# Patient Record
Sex: Female | Born: 1985 | Race: White | Hispanic: No | Marital: Single | Smoking: Former smoker
Health system: Southern US, Community
[De-identification: ages and names within clinical notes are randomized; demographics above are authoritative.]

## PROBLEM LIST (undated history)

## (undated) DIAGNOSIS — F32A Depression, unspecified: Secondary | ICD-10-CM

## (undated) DIAGNOSIS — F1910 Other psychoactive substance abuse, uncomplicated: Secondary | ICD-10-CM

## (undated) DIAGNOSIS — F329 Major depressive disorder, single episode, unspecified: Secondary | ICD-10-CM

## (undated) DIAGNOSIS — F419 Anxiety disorder, unspecified: Secondary | ICD-10-CM

## (undated) HISTORY — DX: Anxiety disorder, unspecified: F41.9

## (undated) HISTORY — DX: Other psychoactive substance abuse, uncomplicated: F19.10

## (undated) HISTORY — DX: Major depressive disorder, single episode, unspecified: F32.9

## (undated) HISTORY — DX: Depression, unspecified: F32.A

---

## 2015-12-04 ENCOUNTER — Ambulatory Visit (INDEPENDENT_AMBULATORY_CARE_PROVIDER_SITE_OTHER): Payer: BLUE CROSS/BLUE SHIELD | Admitting: Emergency Medicine

## 2015-12-04 VITALS — BP 98/64 | HR 124 | Temp 98.4°F | Resp 16 | Ht 63.5 in | Wt 108.6 lb

## 2015-12-04 DIAGNOSIS — R0981 Nasal congestion: Secondary | ICD-10-CM

## 2015-12-04 DIAGNOSIS — F1911 Other psychoactive substance abuse, in remission: Secondary | ICD-10-CM

## 2015-12-04 DIAGNOSIS — R067 Sneezing: Secondary | ICD-10-CM

## 2015-12-04 DIAGNOSIS — F1910 Other psychoactive substance abuse, uncomplicated: Secondary | ICD-10-CM | POA: Diagnosis not present

## 2015-12-04 LAB — POCT RAPID STREP A (OFFICE): RAPID STREP A SCREEN: NEGATIVE

## 2015-12-04 LAB — POCT INFLUENZA A/B
Influenza A, POC: NEGATIVE
Influenza B, POC: NEGATIVE

## 2015-12-04 NOTE — Patient Instructions (Addendum)
IF you received an x-ray today, you will receive an invoice from [facility]. Please contact [facility] at [phone] with questions or concerns regarding your invoice.   IF you received labwork today, you will receive an invoice from [employer]. Please contact [facility] at [phone] with questions or concerns regarding your invoice.   Our billing staff will not be able to assist you with questions regarding bills from these companies.  You will be contacted with the lab results as soon as they are available. The fastest way to get your results is to activate your My Chart account. Instructions are located on the last page of this paperwork. If you have not heard from [place] regarding the results in 2 weeks, please contact this office. Please take plain Zyrtec 10 mg 1 a day. Please try Flonase to help with nasal congestion. Do not take any medications with stimulants in them.

## 2015-12-04 NOTE — Progress Notes (Signed)
By signing my name below, I, Judithe Modest, attest that this documentation has been prepared under the direction and in the presence of Nena Jordan, MD. Electronically Signed: Judithe Modest, ER Scribe. 12/04/2015. 2:17 PM.  Chief Complaint:  Chief Complaint  Patient presents with  . Nasal Congestion    x1 day  . Cough  . sneezing   HPI: Amanda Horn is a 30 y.o. female who reports to [facility] today complaining of sneezing, cough, congestion, and rhinorrhea for the last 24 hours. She tested positive for flu two weeks ago and was prescribed tamiflu. She has had a past hx of sinus infections. She was blowing out green drainage in the morning, but by this afternoon it was clear. She is a current everyday smoker. Her sx this time are very different than her sx two weeks ago. Her teeth hurt. She has no past hx of allergies.    Past Medical History  Diagnosis Date  . Anxiety   . Depression   . Substance abuse    No past surgical history on file. Social History   Social History  . Marital Status: Single    Spouse Name: N/A  . Number of Children: N/A  . Years of Education: N/A   Social History Main Topics  . Smoking status: Current Every Day Smoker  . Smokeless tobacco: Not on file  . Alcohol Use: No  . Drug Use: No  . Sexual Activity: Not on file   Other Topics Concern  . Not on file   Social History Narrative  . No narrative on file   Family History  Problem Relation Age of Onset  . Cancer Maternal Grandmother    Allergies  Allergen Reactions  . Penicillins Hives  . Sulfa Antibiotics Hives   Prior to Admission medications   Medication Sig Start Date End Date Taking? Authorizing Provider  busPIRone (BUSPAR) 30 MG tablet Take 30 mg by mouth 2 (two) times daily.   Yes Historical Provider, MD  gabapentin (NEURONTIN) 600 MG tablet Take 600 mg by mouth 3 (three) times daily.   Yes Historical Provider, MD  propranolol (INDERAL) 10 MG tablet Take 10 mg by mouth 2 (two)  times daily.   Yes Historical Provider, MD  venlafaxine XR (EFFEXOR-XR) 150 MG 24 hr capsule Take 150 mg by mouth daily with breakfast.   Yes Historical Provider, MD     ROS: The patient denies fevers, chills, night sweats, unintentional weight loss, chest pain, palpitations, wheezing, dyspnea on exertion, nausea, vomiting, abdominal pain, dysuria, hematuria, melena, numbness, weakness, or tingling.   All other systems have been reviewed and were otherwise negative with the exception of those mentioned in the HPI and as above.    PHYSICAL EXAM: Filed Vitals:   12/04/15 1357  BP: 98/64  Pulse: 124  Temp: 98.4 F (36.9 C)  Resp: 16   Body mass index is 18.93 kg/(m^2).   General: Alert, no acute distress HEENT:  Normocephalic, atraumatic, oropharynx patent. Throat with erythema. Significant nasal congestion. Ears normal. Eye: [facility], [facility] Cardiovascular:  Regular rate and rhythm, no rubs murmurs or gallops.  No Carotid bruits, radial pulse intact. No pedal edema.  Respiratory: Clear to auscultation bilaterally.  No wheezes, rales, or rhonchi.  No cyanosis, no use of accessory musculature Abdominal: No organomegaly, abdomen is soft and non-tender, positive bowel sounds.  No masses. Musculoskeletal: Gait intact. No edema, tenderness Skin: No rashes. Neurologic: Facial musculature symmetric. Psychiatric: Patient acts appropriately throughout our interaction. Lymphatic:  No cervical or submandibular lymphadenopathy    LABS: Results for orders placed or performed in visit on 12/04/15  POCT rapid strep A  Result Value Ref Range   Rapid Strep A Screen Negative Negative  POCT Influenza A/B  Result Value Ref Range   Influenza A, POC Negative Negative   Influenza B, POC Negative Negative    EKG/XRAY:   Primary read interpreted by [facility]. Everlene Farrier at [facility].   ASSESSMENT/PLAN: Patient will be on Zyrtec and Flonase. No stimulants. Testing for flu and strep are negative. Patient was  tachycardic in the office but has issues with tachycardia and anxiety and is on Inderal for this. I personally performed the services described in this documentation, which was scribed in my presence. The recorded information has been reviewed and is accurate.  Gross sideeffects, risk and benefits, and alternatives of medications d/w patient. Patient is aware that all medications have potential sideeffects and we are unable to predict every sideeffect or drug-drug interaction that may occur.  Arlyss Queen MD 12/04/2015 2:17 PM

## 2016-06-01 ENCOUNTER — Ambulatory Visit (INDEPENDENT_AMBULATORY_CARE_PROVIDER_SITE_OTHER): Payer: BLUE CROSS/BLUE SHIELD | Admitting: Physician Assistant

## 2016-06-01 VITALS — BP 130/74 | HR 99 | Temp 99.3°F | Resp 19 | Ht 63.5 in | Wt 108.4 lb

## 2016-06-01 DIAGNOSIS — Z202 Contact with and (suspected) exposure to infections with a predominantly sexual mode of transmission: Secondary | ICD-10-CM | POA: Diagnosis not present

## 2016-06-01 DIAGNOSIS — N9089 Other specified noninflammatory disorders of vulva and perineum: Secondary | ICD-10-CM | POA: Diagnosis not present

## 2016-06-01 MED ORDER — VALACYCLOVIR HCL 500 MG PO TABS: 500.0000 mg | ORAL_TABLET | Freq: Two times a day (BID) | ORAL | 0 refills | Status: AC

## 2016-06-01 NOTE — Progress Notes (Signed)
   Amanda Horn  MRN: [id number] DOB: 1985/12/09  PCP: No primary care provider on file.  Subjective:  Pt is a 30 y.o. female, history of substance abuse, presenting to clinic for lesion on vagina x 3 days. She is accompanied by her sexual partner. She first noticed the single lesion after she shaved. Does not recall cutting herself with razor.  Painful to touch.  No drainage, discharge, burning, itching. Denies fever, chills, recent illness.  Has unprotected sex with new partner (present today). He does not have known history of HSV or STDs.  No history of herpes outbreaks.   Review of Systems  Constitutional: Negative.   HENT: Negative for mouth sores.   Gastrointestinal: Negative.   Genitourinary: Positive for genital sores. Negative for difficulty urinating, dyspareunia, dysuria, menstrual problem, pelvic pain, urgency, vaginal bleeding, vaginal discharge and vaginal pain.  Skin: Negative.     Patient Active Problem List   Diagnosis Date Noted  . 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 gabapentin (NEURONTIN) 600 MG tablet Take 600 mg by mouth 3 (three) times daily.    Marland Kitchen venlafaxine XR (EFFEXOR-XR) 150 MG 24 hr capsule Take 150 mg by mouth daily with breakfast.     No current facility-administered medications on file prior to visit.     Allergies  Allergen Reactions  . Penicillins Hives  . Sulfa Antibiotics Hives    Objective:  BP 130/74 (BP Location: Right Arm, Patient Position: Sitting, Cuff Size: Small)   Pulse 99   Temp 99.3 F (37.4 C) (Oral)   Resp 19   Ht 5' 3.5" (1.613 m)   Wt 108 lb 6.4 oz (49.2 kg)   LMP 05/11/2016   SpO2 100%   BMI 18.90 kg/m   Physical Exam  Constitutional: She is oriented to person, place, and time and well-developed, well-nourished, and in no distress. No distress.  Cardiovascular: Normal rate, regular rhythm and normal heart sounds.     Pulmonary/Chest: Effort normal. No respiratory distress.  Genitourinary: Cervix exhibits no tenderness. Vulva exhibits lesion (Posterior labia minora, left. 0.5 cm Nodular, TTP. No erythema, drainage ).  Genitourinary Comments: Cervix, Nabothian cyst. No abnormal transformation zone. Cervix is friable.   Neurological: She is alert and oriented to person, place, and time. GCS score is 15.  Skin: Skin is warm and dry.  Psychiatric: Mood, memory, affect and judgment normal.  Vitals reviewed.   Assessment and Plan :  1. Possible exposure to STD - Pap IG, CT/NG w/ reflex HPV when ASC-U - Herpes simplex virus culture 2. Labial lesion - valACYclovir (VALTREX) 500 MG tablet; Take 1 tablet (500 mg total) by mouth 2 (two) times daily.  Dispense: 20 tablet; Refill: 0 - Patient asked for Valtrex just in case this is a herpes outbreak. Does not want to wait until the culture comes back.    Mercer Pod, PA-C  [facility] 06/01/2016 6:50 PM

## 2016-06-01 NOTE — Patient Instructions (Signed)
     IF you received an x-ray today, you will receive an invoice from [facility]. Please contact [facility] at [phone] with questions or concerns regarding your invoice.   IF you received labwork today, you will receive an invoice from [facility]. Please contact [facility] at [phone] with questions or concerns regarding your invoice.   Our billing staff will not be able to assist you with questions regarding bills from these companies.  You will be contacted with the lab results as soon as they are available. The fastest way to get your results is to activate your My Chart account. Instructions are located on the last page of this paperwork. If you have not heard from us regarding the results in 2 weeks, please contact this office.      

## 2016-06-04 LAB — PAP IG, CT-NG, RFX HPV ASCU
Chlamydia Probe Amp: NOT DETECTED
GC Probe Amp: NOT DETECTED

## 2016-06-06 ENCOUNTER — Telehealth: Payer: Self-pay

## 2016-06-06 NOTE — Telephone Encounter (Signed)
Patient was recently seen for a possible STD and states that the medication isn't working. Patient states that there are no side effect but the symptoms are getting worse.  Please advise!  Patient would like [place] to leave a voicemail if she doesn't answer. [phone]

## 2016-06-07 LAB — HERPES SIMPLEX VIRUS CULTURE: Organism ID, Bacteria: DETECTED

## 2016-06-11 ENCOUNTER — Other Ambulatory Visit: Payer: Self-pay | Admitting: Physician Assistant

## 2016-06-11 DIAGNOSIS — B009 Herpesviral infection, unspecified: Secondary | ICD-10-CM | POA: Insufficient documentation

## 2016-06-11 MED ORDER — VALACYCLOVIR HCL 1 G PO TABS: 1000.0000 mg | ORAL_TABLET | Freq: Two times a day (BID) | ORAL | 0 refills | Status: DC

## 2016-06-11 NOTE — Telephone Encounter (Signed)
Pt states that her symptoms are not improving and would like to get an increased dosage of valtrex. Pt states that she was under the impression the dosage would increase once the culture came back as positive.

## 2016-06-11 NOTE — Telephone Encounter (Signed)
Please let patient know her prescription should be at her pharmacy.

## 2016-06-11 NOTE — Telephone Encounter (Signed)
Patient still has symptons and would like stronger medications called in, based on telephone message LOvM

## 2016-06-13 NOTE — Telephone Encounter (Signed)
Called pt, left voicemail stating that prescription is available for pick up

## 2016-07-22 ENCOUNTER — Telehealth: Payer: Self-pay | Admitting: Family Medicine

## 2016-07-22 DIAGNOSIS — B009 Herpesviral infection, unspecified: Secondary | ICD-10-CM

## 2016-07-22 MED ORDER — VALACYCLOVIR HCL 1 G PO TABS: 1000.0000 mg | ORAL_TABLET | Freq: Every day | ORAL | 5 refills | Status: AC

## 2016-07-22 NOTE — Telephone Encounter (Signed)
Patient called after hours line; requesting refill of Valtrex.  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 Spoke with patient to discuss suppression option; sent in to local pharmacy.

## 2017-03-26 ENCOUNTER — Other Ambulatory Visit: Payer: Self-pay | Admitting: Family Medicine

## 2017-03-26 DIAGNOSIS — N63 Unspecified lump in unspecified breast: Secondary | ICD-10-CM

## 2017-03-28 ENCOUNTER — Other Ambulatory Visit: Payer: Self-pay | Admitting: Family Medicine

## 2017-03-28 DIAGNOSIS — N63 Unspecified lump in unspecified breast: Secondary | ICD-10-CM

## 2017-04-04 ENCOUNTER — Ambulatory Visit
Admission: RE | Admit: 2017-04-04 | Discharge: 2017-04-04 | Disposition: A | Discharge status: Home / Self Care | Payer: BLUE CROSS/BLUE SHIELD | Source: Ambulatory Visit | Attending: Family Medicine | Admitting: Family Medicine

## 2017-04-04 ENCOUNTER — Other Ambulatory Visit: Payer: Self-pay | Admitting: Family Medicine

## 2017-04-04 DIAGNOSIS — N63 Unspecified lump in unspecified breast: Secondary | ICD-10-CM

## 2017-04-22 ENCOUNTER — Other Ambulatory Visit: Payer: Self-pay | Admitting: Family Medicine

## 2017-04-22 DIAGNOSIS — N6310 Unspecified lump in the right breast, unspecified quadrant: Secondary | ICD-10-CM

## 2017-04-22 DIAGNOSIS — N644 Mastodynia: Secondary | ICD-10-CM

## 2017-04-23 ENCOUNTER — Other Ambulatory Visit: Payer: Self-pay | Admitting: Family Medicine

## 2017-04-23 DIAGNOSIS — N6310 Unspecified lump in the right breast, unspecified quadrant: Secondary | ICD-10-CM

## 2017-04-23 DIAGNOSIS — N644 Mastodynia: Secondary | ICD-10-CM

## 2017-04-23 DIAGNOSIS — N63 Unspecified lump in unspecified breast: Secondary | ICD-10-CM

## 2017-04-30 ENCOUNTER — Other Ambulatory Visit: Payer: BLUE CROSS/BLUE SHIELD

## 2017-12-04 ENCOUNTER — Encounter: Payer: BLUE CROSS/BLUE SHIELD | Admitting: Certified Nurse Midwife

## 2018-02-07 ENCOUNTER — Encounter: Payer: Self-pay | Admitting: Family Medicine

## 2018-02-12 ENCOUNTER — Encounter: Payer: Self-pay | Admitting: Family Medicine

## 2018-03-11 ENCOUNTER — Ambulatory Visit: Payer: Managed Care, Other (non HMO) | Admitting: Physician Assistant

## 2018-03-11 ENCOUNTER — Encounter

## 2018-03-11 ENCOUNTER — Other Ambulatory Visit: Payer: Self-pay

## 2018-03-11 ENCOUNTER — Encounter: Payer: Self-pay | Admitting: Physician Assistant

## 2018-03-11 DIAGNOSIS — D229 Melanocytic nevi, unspecified: Secondary | ICD-10-CM | POA: Diagnosis not present

## 2018-03-11 DIAGNOSIS — F17200 Nicotine dependence, unspecified, uncomplicated: Secondary | ICD-10-CM

## 2018-03-11 DIAGNOSIS — F1911 Other psychoactive substance abuse, in remission: Secondary | ICD-10-CM | POA: Diagnosis not present

## 2018-03-11 DIAGNOSIS — F411 Generalized anxiety disorder: Secondary | ICD-10-CM | POA: Diagnosis not present

## 2018-03-11 NOTE — Patient Instructions (Addendum)
   IF you received an x-ray today, you will receive an invoice from [facility]. Please contact [facility] at [phone] with questions or concerns regarding your invoice.   IF you received labwork today, you will receive an invoice from [facility]. Please contact [facility] at [phone] with questions or concerns regarding your invoice.   Our billing staff will not be able to assist you with questions regarding bills from these companies.  You will be contacted with the lab results as soon as they are available. The fastest way to get your results is to activate your My Chart account. Instructions are located on the last page of this paperwork. If you have not heard from us regarding the results in 2 weeks, please contact this office.    Health Maintenance, Female Adopting a healthy lifestyle and getting preventive care can go a long way to promote health and wellness. Talk with your health care provider about what schedule of regular examinations is right for you. This is a good chance for you to check in with your provider about disease prevention and staying healthy. In between checkups, there are plenty of things you can do on your own. Experts have done a lot of research about which lifestyle changes and preventive measures are most likely to keep you healthy. Ask your health care provider for more information. Weight and diet Eat a healthy diet  Be sure to include plenty of vegetables, fruits, low-fat dairy products, and lean protein.  Do not eat a lot of foods high in solid fats, added sugars, or salt.  Get regular exercise. This is one of the most important things you can do for your health. ? Most adults should exercise for at least 150 minutes each week. The exercise should increase your heart rate and make you sweat (moderate-intensity exercise). ? Most adults should also do strengthening exercises at least twice a week. This is in addition to the  moderate-intensity exercise.  Maintain a healthy weight  Body mass index (BMI) is a measurement that can be used to identify possible weight problems. It estimates body fat based on height and weight. Your health care provider can help determine your BMI and help you achieve or maintain a healthy weight.  For females 20 years of age and older: ? A BMI below 18.5 is considered underweight. ? A BMI of 18.5 to 24.9 is normal. ? A BMI of 25 to 29.9 is considered overweight. ? A BMI of 30 and above is considered obese.  Watch levels of cholesterol and blood lipids  You should start having your blood tested for lipids and cholesterol at 31 years of age, then have this test every 5 years.  You may need to have your cholesterol levels checked more often if: ? Your lipid or cholesterol levels are high. ? You are older than 31 years of age. ? You are at high risk for heart disease.  Cancer screening Lung Cancer  Lung cancer screening is recommended for adults 55-80 years old who are at high risk for lung cancer because of a history of smoking.  A yearly low-dose CT scan of the lungs is recommended for people who: ? Currently smoke. ? Have quit within the past 15 years. ? Have at least a 30-pack-year history of smoking. A pack year is smoking an average of one pack of cigarettes a day for 1 year.  Yearly screening should continue until it has been 15 years since you quit.  Yearly screening   should stop if you develop a health problem that would prevent you from having lung cancer treatment.  Breast Cancer  Practice breast self-awareness. This means understanding how your breasts normally appear and feel.  It also means doing regular breast self-exams. Let your health care provider know about any changes, no matter how small.  If you are in your 20s or 30s, you should have a clinical breast exam (CBE) by a health care provider every 1-3 years as part of a regular health exam.  If you  are 42 or older, have a CBE every year. Also consider having a breast X-ray (mammogram) every year.  If you have a family history of breast cancer, talk to your health care provider about genetic screening.  If you are at high risk for breast cancer, talk to your health care provider about having an MRI and a mammogram every year.  Breast cancer gene (BRCA) assessment is recommended for women who have family members with BRCA-related cancers. BRCA-related cancers include: ? Breast. ? Ovarian. ? Tubal. ? Peritoneal cancers.  Results of the assessment will determine the need for genetic counseling and BRCA1 and BRCA2 testing.  Cervical Cancer Your health care provider may recommend that you be screened regularly for cancer of the pelvic organs (ovaries, uterus, and vagina). This screening involves a pelvic examination, including checking for microscopic changes to the surface of your cervix (Pap test). You may be encouraged to have this screening done every 3 years, beginning at age 56.  For women ages 41-65, health care providers may recommend pelvic exams and Pap testing every 3 years, or they may recommend the Pap and pelvic exam, combined with testing for human papilloma virus (HPV), every 5 years. Some types of HPV increase your risk of cervical cancer. Testing for HPV may also be done on women of any age with unclear Pap test results.  Other health care providers may not recommend any screening for nonpregnant women who are considered low risk for pelvic cancer and who do not have symptoms. Ask your health care provider if a screening pelvic exam is right for you.  If you have had past treatment for cervical cancer or a condition that could lead to cancer, you need Pap tests and screening for cancer for at least 20 years after your treatment. If Pap tests have been discontinued, your risk factors (such as having a new sexual partner) need to be reassessed to determine if screening should  resume. Some women have medical problems that increase the chance of getting cervical cancer. In these cases, your health care provider may recommend more frequent screening and Pap tests.  Colorectal Cancer  This type of cancer can be detected and often prevented.  Routine colorectal cancer screening usually begins at 31 years of age and continues through 31 years of age.  Your health care provider may recommend screening at an earlier age if you have risk factors for colon cancer.  Your health care provider may also recommend using home test kits to check for hidden blood in the stool.  A small camera at the end of a tube can be used to examine your colon directly (sigmoidoscopy or colonoscopy). This is done to check for the earliest forms of colorectal cancer.  Routine screening usually begins at age 16.  Direct examination of the colon should be repeated every 5-10 years through 31 years of age. However, you may need to be screened more often if early forms of precancerous polyps or  small growths are found.  Skin Cancer  Check your skin from head to toe regularly.  Tell your health care provider about any new moles or changes in moles, especially if there is a change in a mole's shape or color.  Also tell your health care provider if you have a mole that is larger than the size of a pencil eraser.  Always use sunscreen. Apply sunscreen liberally and repeatedly throughout the day.  Protect yourself by wearing long sleeves, pants, a wide-brimmed hat, and sunglasses whenever you are outside.  Heart disease, diabetes, and high blood pressure  High blood pressure causes heart disease and increases the risk of stroke. High blood pressure is more likely to develop in: ? People who have blood pressure in the high end of the normal range (130-139/85-89 mm Hg). ? People who are overweight or obese. ? People who are African American.  If you are 61-36 years of age, have your blood  pressure checked every 3-5 years. If you are 6 years of age or older, have your blood pressure checked every year. You should have your blood pressure measured twice-once when you are at a hospital or clinic, and once when you are not at a hospital or clinic. Record the average of the two measurements. To check your blood pressure when you are not at a hospital or clinic, you can use: ? An automated blood pressure machine at a pharmacy. ? A home blood pressure monitor.  If you are between 7 years and 29 years old, ask your health care provider if you should take aspirin to prevent strokes.  Have regular diabetes screenings. This involves taking a blood sample to check your fasting blood sugar level. ? If you are at a normal weight and have a low risk for diabetes, have this test once every three years after 31 years of age. ? If you are overweight and have a high risk for diabetes, consider being tested at a younger age or more often. Preventing infection Hepatitis B  If you have a higher risk for hepatitis B, you should be screened for this virus. You are considered at high risk for hepatitis B if: ? You were born in a country where hepatitis B is common. Ask your health care provider which countries are considered high risk. ? Your parents were born in a high-risk country, and you have not been immunized against hepatitis B (hepatitis B vaccine). ? You have HIV or AIDS. ? You use needles to inject street drugs. ? You live with someone who has hepatitis B. ? You have had sex with someone who has hepatitis B. ? You get hemodialysis treatment. ? You take certain medicines for conditions, including cancer, organ transplantation, and autoimmune conditions.  Hepatitis C  Blood testing is recommended for: ? Everyone born from [birth year] through 1964. ? Anyone with known risk factors for hepatitis C.  Sexually transmitted infections (STIs)  You should be screened for sexually transmitted  infections (STIs) including gonorrhea and chlamydia if: ? You are sexually active and are younger than 31 years of age. ? You are older than 31 years of age and your health care provider tells you that you are at risk for this type of infection. ? Your sexual activity has changed since you were last screened and you are at an increased risk for chlamydia or gonorrhea. Ask your health care provider if you are at risk.  If you do not have HIV, but are at risk, it  may be recommended that you take a prescription medicine daily to prevent HIV infection. This is called pre-exposure prophylaxis (PrEP). You are considered at risk if: ? You are sexually active and do not regularly use condoms or know the HIV status of your partner(s). ? You take drugs by injection. ? You are sexually active with a partner who has HIV.  Talk with your health care provider about whether you are at high risk of being infected with HIV. If you choose to begin PrEP, you should first be tested for HIV. You should then be tested every 3 months for as long as you are taking PrEP. Pregnancy  If you are premenopausal and you may become pregnant, ask your health care provider about preconception counseling.  If you may become pregnant, take 400 to 800 micrograms (mcg) of folic acid every day.  If you want to prevent pregnancy, talk to your health care provider about birth control (contraception). Osteoporosis and menopause  Osteoporosis is a disease in which the bones lose minerals and strength with aging. This can result in serious bone fractures. Your risk for osteoporosis can be identified using a bone density scan.  If you are 65 years of age or older, or if you are at risk for osteoporosis and fractures, ask your health care provider if you should be screened.  Ask your health care provider whether you should take a calcium or vitamin D supplement to lower your risk for osteoporosis.  Menopause may have certain physical  symptoms and risks.  Hormone replacement therapy may reduce some of these symptoms and risks. Talk to your health care provider about whether hormone replacement therapy is right for you. Follow these instructions at home:  Schedule regular health, dental, and eye exams.  Stay current with your immunizations.  Do not use any tobacco products including cigarettes, chewing tobacco, or electronic cigarettes.  If you are pregnant, do not drink alcohol.  If you are breastfeeding, limit how much and how often you drink alcohol.  Limit alcohol intake to no more than 1 drink per day for nonpregnant women. One drink equals 12 ounces of beer, 5 ounces of wine, or 1 ounces of hard liquor.  Do not use street drugs.  Do not share needles.  Ask your health care provider for help if you need support or information about quitting drugs.  Tell your health care provider if you often feel depressed.  Tell your health care provider if you have ever been abused or do not feel safe at home. This information is not intended to replace advice given to you by your health care provider. Make sure you discuss any questions you have with your health care provider. Document Released: 03/26/2011 Document Revised: 02/16/2016 Document Reviewed: 06/14/2015 [employer] Interactive Patient Education  2018 [employer]  

## 2018-03-11 NOTE — Progress Notes (Signed)
Amanda Horn  MRN: [id number] DOB: 1986-03-08  PCP: Jonathon Jordan, MD  Chief Complaint  Patient presents with  . Establish Care    Subjective:  Pt presents to clinic to establish care.  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  Working on quitting smoking - down to 2 cig a day - she was a pack and half day smoker. For about 3 years  Parents live in [place] and she has been in treatment there and that is where she was started on baclofen for ETOH cravings and it does seems to help - she used it more during her early recovery and now she uses it prn.  She is treated for her anxiety which is what leads to her substance abuse.  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  Review of Systems  Constitutional: Negative for chills and fever.  Psychiatric/Behavioral: Negative for dysphoric mood. The patient is nervous/anxious.     Patient Active Problem List   Diagnosis Date Noted  . Multiple nevi 03/11/2018  . GAD (generalized anxiety disorder) 03/11/2018  . Smoker 03/11/2018  . 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 baclofen (LIORESAL) 10 MG tablet Take 10 mg by mouth 3 (three) times daily.    Marland Kitchen gabapentin (NEURONTIN) 600 MG tablet Take 600 mg by mouth 3 (three) times daily.    . valACYclovir (VALTREX) 1000 MG tablet Take 1 tablet (1,000 mg total) by mouth daily. Increase to bid for three days PRN outbreak 40 tablet 5  . venlafaxine XR (EFFEXOR-XR) 150 MG 24 hr capsule Take 150 mg by mouth daily with breakfast.     No current facility-administered medications on file prior to visit.     Allergies  Allergen Reactions  . Penicillins Hives  . Sulfa Antibiotics Hives    Past Medical History:  Diagnosis  Date  . Anxiety   . Depression   . 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 Smoking status: Current Every Day Smoker    Packs/day: 1.50    Start date: 03/11/2014  . Smokeless tobacco: Never Used  Substance Use Topics  . Alcohol use: No    Alcohol/week: 0.0 oz  . Drug use: No   family history includes Cancer in her maternal grandmother.     Objective:  BP 108/62   Pulse 87   Temp 99.2 F (37.3 C) (Oral)   Resp 18   Ht 5' 4.17" (1.63 m)   Wt 109 lb 6.4 oz (49.6 kg)   LMP 02/16/2018   SpO2 99%   BMI 18.68 kg/m  Body mass index is 18.68 kg/m.  Wt Readings from Last 3 Encounters:  03/11/18 109 lb 6.4 oz (49.6 kg)  06/01/16 108 lb 6.4 oz (49.2 kg)  12/04/15 108 lb 9.6 oz (49.3 kg)    Physical Exam  Constitutional: She is oriented to person, place, and time. She appears well-developed and well-nourished.  HENT:  Head: Normocephalic and atraumatic.  Right  Ear: Hearing and external ear normal.  Left Ear: Hearing and external ear normal.  Eyes: Conjunctivae are normal.  Neck: Normal range of motion.  Cardiovascular: Normal rate, regular rhythm and normal heart sounds.  No murmur heard. Pulmonary/Chest: Effort normal and breath sounds normal. She has no wheezes.  Neurological: She is alert and oriented to person, place, and time.  Skin: Skin is warm and dry.  Multiple nevus  Psychiatric: She has a normal mood and affect. Her behavior is normal. Judgment and thought content normal.  Vitals reviewed.   Assessment and Plan :  Multiple nevi congestive -  Plan: Ambulatory referral to Dermatology -patient has had 2 moles removed and has multiple nevi and warrants a dermatological yearly evaluation  GAD (generalized anxiety disorder) -well-controlled on current medications, continue current medications we will follow-up  when she needs refills.  Substance abuse in remission [facility]) -continue attending NA meetings and medications for substance abuse.  Smoker -congratulated on smoking cessation and encouraged to continue decreased   Windell Hummingbird PA-C  Primary Care at [place] 03/11/2018 2:50 PM

## 2018-03-17 ENCOUNTER — Encounter: Payer: Self-pay | Admitting: Physician Assistant

## 2018-04-29 ENCOUNTER — Ambulatory Visit: Payer: Managed Care, Other (non HMO) | Admitting: Physician Assistant

## 2018-04-29 ENCOUNTER — Encounter: Payer: Self-pay | Admitting: Physician Assistant

## 2018-04-29 ENCOUNTER — Other Ambulatory Visit: Payer: Self-pay

## 2018-04-29 VITALS — BP 102/68 | HR 83 | Temp 98.0°F | Resp 16 | Ht 64.96 in | Wt 112.8 lb

## 2018-04-29 DIAGNOSIS — L700 Acne vulgaris: Secondary | ICD-10-CM | POA: Diagnosis not present

## 2018-04-29 DIAGNOSIS — S90932A Unspecified superficial injury of left great toe, initial encounter: Secondary | ICD-10-CM

## 2018-04-29 DIAGNOSIS — T148XXA Other injury of unspecified body region, initial encounter: Secondary | ICD-10-CM

## 2018-04-29 DIAGNOSIS — Z3009 Encounter for other general counseling and advice on contraception: Secondary | ICD-10-CM | POA: Diagnosis not present

## 2018-04-29 LAB — POCT URINE PREGNANCY: PREG TEST UR: NEGATIVE

## 2018-04-29 MED ORDER — TRETINOIN 0.025 % EX GEL: Freq: Every day | CUTANEOUS | 1 refills | Status: AC

## 2018-04-29 MED ORDER — NORETHINDRONE ACET-ETHINYL EST 1-20 MG-MCG PO TABS: 1.0000 | ORAL_TABLET | Freq: Every day | ORAL | 4 refills | Status: AC

## 2018-04-29 NOTE — Progress Notes (Signed)
Amanda Horn  MRN: [id number] DOB: 1986/07/10  Subjective:  Amanda Horn is a 32 y.o. female seen in office today for a chief complaint of multiple complaints.   1) Toe injury: Has had a repetitive cut on bottom of big toe of left foot x 3 weeks. Does hot yoga and keeps reopening it on the mat.Yesterday, had some surrounding redness and swelling. "this morning some white stuff came out, not pus." Put neosporin on it today for the first time. Does hot yoga 8 x a week. On her feet for 9 hours at work.   2) Acne: Has dealt with face and truncal acne since she can remember. 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 Used to be on retin-a gel and this helped a lot. Was getting it from dermatologist in [place]. Has not had any since moving here. Using salycilic acid and benyzol peroxide cream face wash. Has also tried differin at home with no full relief.  3) birth control: Pt would also like to start oral birth control. Has tried different ones in the past.  No preference.  She is currently on her menstrual cycle.  Has not had sexual activity since being on her menstrual cycle.  No past medical history of HTN, heart disease, breast cancer, endometrial cancer, thromboembolism, migraine with aura, or diabetes.  No FH of stroke or thromboembolism. Denies smoking.    Review of Systems  Constitutional: Negative for chills, diaphoresis and fever.  Cardiovascular: Negative for chest pain.  Gastrointestinal: Negative for diarrhea and vomiting.  Neurological: Negative for dizziness and light-headedness.    Patient Active Problem List   Diagnosis Date Noted  . Multiple nevi 03/11/2018  . GAD (generalized anxiety disorder) 03/11/2018  . Smoker 03/11/2018  . 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 baclofen (LIORESAL) 10 MG tablet Take 10 mg by mouth 3  (three) times daily.    Marland Kitchen gabapentin (NEURONTIN) 600 MG tablet Take 600 mg by mouth 3 (three) times daily.    . valACYclovir (VALTREX) 1000 MG tablet Take 1 tablet (1,000 mg total) by mouth daily. Increase to bid for three days PRN outbreak 40 tablet 5  . venlafaxine XR (EFFEXOR-XR) 150 MG 24 hr capsule Take 150 mg by mouth daily with breakfast.     No current facility-administered medications on file prior to visit.     Allergies  Allergen Reactions  . Penicillins Hives  . Sulfa Antibiotics Hives   Social History   Socioeconomic History  . Marital status: Single    Spouse name: Not on file  . Number of children: Not on file  . Years of education: Not on file  . Highest education level: Not on file  Occupational History  . Not on file  Social Needs  . Financial resource strain: Not on file  . Food insecurity:    Worry: Not on file    Inability: Not on file  . Transportation needs:    Medical: Not on file    Non-medical: Not on file  Tobacco Use  . Smoking status: Current Every Day Smoker    Packs/day: 1.50    Start date: 03/11/2014  . Smokeless tobacco: Never Used  Substance and Sexual Activity  . Alcohol use: No    Alcohol/week: 0.0 oz  . Drug use: No  . Sexual activity:  Yes    Partners: Male  Lifestyle  . Physical activity:    Days per week: Not on file    Minutes per session: Not on file  . Stress: Not on file  Relationships  . Social connections:    Talks on phone: Not on file    Gets together: Not on file    Attends religious service: Not on file    Active member of club or organization: Not on file    Attends meetings of clubs or organizations: Not on file    Relationship status: Not on file  . Intimate partner violence:    Fear of current or ex partner: Not on file    Emotionally abused: Not on file    Physically abused: Not on file    Forced sexual activity: Not on file  Other Topics Concern  . 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 She appears well-developed and well-nourished. No distress.  HENT:  Head: Normocephalic and atraumatic.  Eyes: Conjunctivae are normal.  Neck: Normal range of motion.  Pulmonary/Chest: Effort normal.  Musculoskeletal:       Left foot: There is normal range of motion, no tenderness, no bony tenderness, no swelling and normal capillary refill.  Neurological: She is alert and oriented to person, place, and time.  Skin: Skin is warm and dry.  Two small skin v shaped skin abrasion with overlying skin flap on sole of left big toe. No bleeding, erythema, warmth, or purulent drainage noted. No TTP.  Multiple open comedones noted on chin and anterior trunk.    Psychiatric: She has a normal mood and affect.  Vitals reviewed.    Results for orders placed or performed in visit on 04/29/18 (from the past 24 hour(s))  POCT urine pregnancy     Status: None   Collection Time: 04/29/18 12:24 PM  Result Value Ref Range   Preg Test, Ur Negative Negative   Wound care: Dressing removed for evaluation.  Mupirocin ointment applied.  Small dressing applied.  Assessment and Plan :  1. Skin abrasion Well-healing.  No concern for infection.  Recommended topical bacitracin ointment and keeping wound covered until fully healed.  Given strict return precautions.  2. Birth control counseling Point-of-care pregnancy test negative.  Encouraged to use back-up contraceptive for first 7 days of use.  Educated that birth control does not STDs, will need to use condoms for STD prevention.  Follow-up as needed. - POCT urine pregnancy - norethindrone-ethinyl estradiol (MICROGESTIN,JUNEL,LOESTRIN)  1-20 MG-MCG tablet; Take 1 tablet by mouth daily.  Dispense: 3 Package; Refill: 4  3. Acne vulgaris Recommended benzyl peroxide in the morning and Retin-A at nighttime.  Continue daily facial cleansing.  Follow-up as needed. - tretinoin (RETIN-A) 0.025 % gel; Apply topically at bedtime.  Dispense: 45 g; Refill: 1  Side effects, risks, benefits, and alternatives of the medications and treatment plan prescribed today were discussed, and patient expressed understanding of the instructions given. No barriers to  understanding were identified. Red flags discussed in detail. Pt expressed understanding regarding what to do in case of emergency/urgent symptoms.  Tenna Delaine PA-C  Primary Care at [facility] 04/29/2018 12:30 PM

## 2018-04-29 NOTE — Patient Instructions (Addendum)
For toe, keep covered until fully healed. Use topical bacitracin and cleanse with soap and water daily.  For ance: ? Morning: Wash face with a gentle facial cleanser. Apply a thin layer of n benzoyl peroxide to entire face.   ? Night: Wash face with a gentle facial cleanser. Apply a thin layer of the topical retinoid to the entire face.   Topical tretinoin should not be applied at the same time as benzoyl peroxide. T Patients should be directed to apply a thin layer of the topical retinoid to the affected areas; a pea-sized amount of medication is usually sufficient to cover the face. Due to the preventive effect of topical retinoids on acne, the medication should be applied to the entire affected area, not as spot treatment of individual lesions. Skin should be dry at the time of application. Skin irritation is a common and expected side effect of topical retinoid therapy.   For birth control, start Sunday after your cycle stops. Use back up method such as condoms for first 7 days. It can take up to 3 months to regulate your cycles.  Thank you for letting me participate in your health and well being.    IF you received an x-ray today, you will receive an invoice from [facility]. Please contact [facility] at [phone] with questions or concerns regarding your invoice.   IF you received labwork today, you will receive an invoice from [facility]. Please contact [facility] at [phone] with questions or concerns regarding your invoice.   Our billing staff will not be able to assist you with questions regarding bills from these companies.  You will be contacted with the lab results as soon as they are available. The fastest way to get your results is to activate your My Chart account. Instructions are located on the last page of this paperwork. If you have not heard from [place] regarding the results in 2 weeks, please contact this office.

## 2018-07-18 ENCOUNTER — Other Ambulatory Visit: Payer: Self-pay | Admitting: Physician Assistant

## 2018-07-18 DIAGNOSIS — N6320 Unspecified lump in the left breast, unspecified quadrant: Secondary | ICD-10-CM

## 2018-07-23 ENCOUNTER — Ambulatory Visit
Admission: RE | Admit: 2018-07-23 | Discharge: 2018-07-23 | Disposition: A | Discharge status: Home / Self Care | Payer: Managed Care, Other (non HMO) | Source: Ambulatory Visit | Attending: Physician Assistant | Admitting: Physician Assistant

## 2018-07-23 ENCOUNTER — Ambulatory Visit
Admission: RE | Admit: 2018-07-23 | Discharge: 2018-07-23 | Disposition: A | Discharge status: Home / Self Care | Payer: BLUE CROSS/BLUE SHIELD | Source: Ambulatory Visit | Attending: Physician Assistant | Admitting: Physician Assistant

## 2018-07-23 ENCOUNTER — Other Ambulatory Visit: Payer: Self-pay | Admitting: Physician Assistant

## 2018-07-23 DIAGNOSIS — N6310 Unspecified lump in the right breast, unspecified quadrant: Secondary | ICD-10-CM

## 2018-07-23 DIAGNOSIS — N6320 Unspecified lump in the left breast, unspecified quadrant: Secondary | ICD-10-CM

## 2018-10-08 ENCOUNTER — Encounter: Payer: Self-pay | Admitting: Physician Assistant

## 2018-10-09 ENCOUNTER — Telehealth: Payer: Managed Care, Other (non HMO) | Admitting: Physician Assistant

## 2018-10-09 DIAGNOSIS — K529 Noninfective gastroenteritis and colitis, unspecified: Secondary | ICD-10-CM

## 2018-10-09 MED ORDER — ONDANSETRON HCL 4 MG PO TABS: 4.0000 mg | ORAL_TABLET | Freq: Three times a day (TID) | ORAL | 0 refills | Status: DC | PRN

## 2018-10-09 NOTE — Progress Notes (Signed)
We are sorry that you are not feeling well.  Here is how we plan to help!  Based on what you have shared with me it looks like you have Acute Infectious Diarrhea.  Most cases of acute diarrhea are due to infections with virus and bacteria and are self-limited conditions lasting less than 14 days.  For your symptoms you may take Imodium 2 mg tablets that are over the counter at your local pharmacy. Take two tablet now and then one after each loose stool up to 6 a day.  Antibiotics are not needed for most people with diarrhea.  Optional: Zofran 4 mg 1 tablet every 8 hours as needed for nausea and vomiting  Optional: I have sent in Cipro 500 mg two tablets twice a day for five days or azithromycin 500 mg daily for 3 days.  HOME CARE  We recommend changing your diet to help with your symptoms for the next few days.  Drink plenty of fluids that contain water salt and sugar. Sports drinks such as Gatorade may help.   You may try broths, soups, bananas, applesauce, soft breads, mashed potatoes or crackers.   You are considered infectious for as long as the diarrhea continues. Hand washing or use of alcohol based hand sanitizers is recommend.  It is best to stay out of work or school until your symptoms stop.   GET HELP RIGHT AWAY  If you have dark yellow colored urine or do not pass urine frequently you should drink more fluids.    If your symptoms worsen   If you feel like you are going to pass out (faint)  You have a new problem  MAKE SURE YOU   Understand these instructions.  Will watch your condition.  Will get help right away if you are not doing well or get worse.  Your e-visit answers were reviewed by a board certified advanced clinical practitioner to complete your personal care plan.  Depending on the condition, your plan could have included both over the counter or prescription medications.  If there is a problem please reply  once you have received a response from  your provider.  Your safety is important to [place].  If you have drug allergies check your prescription carefully.    You can use MyChart to ask questions about today's visit, request a non-urgent call back, or ask for a work or school excuse for 24 hours related to this e-Visit. If it has been greater than 24 hours you will need to follow up with your provider, or enter a new e-Visit to address those concerns.   You will get an e-mail in the next two days asking about your experience.  I hope that your e-visit has been valuable and will speed your recovery. Thank you for using e-visits.

## 2018-10-15 ENCOUNTER — Ambulatory Visit: Payer: Self-pay | Admitting: *Deleted

## 2018-10-15 NOTE — Telephone Encounter (Signed)
Message from Rutherford Nail, [place] sent at 10/15/2018 4:51 PM EST   Summary: 3 thin cuts on finger/ questioning if tetanus shot is needed   Patient calling and states that she was at work and cut her finger. States that it is 3 thin cuts, almost like a paper cut. States she cut it on a vent that had dust on it. Does not know if this is something that requires a tetanus shot. Says there was no rust on the vents at all, she just has dust in the cuts now that she is currently trying to get out. Cuts have stopped bleeding. Please advise.             Attempted to call pt but no answer at this time. Unable to leave message due to voicemail not being set up.

## 2018-10-15 NOTE — Telephone Encounter (Signed)
Called pt for further assessment. Pt states she is presently at [place].  States "The cut was deeper than I thought."

## 2018-11-13 ENCOUNTER — Encounter: Payer: Self-pay | Admitting: Family Medicine

## 2018-11-13 ENCOUNTER — Other Ambulatory Visit: Payer: Self-pay

## 2018-11-13 ENCOUNTER — Ambulatory Visit: Payer: Managed Care, Other (non HMO) | Admitting: Family Medicine

## 2018-11-13 VITALS — BP 110/73 | HR 81 | Temp 98.4°F | Ht 63.5 in | Wt 113.5 lb

## 2018-11-13 DIAGNOSIS — Z09 Encounter for follow-up examination after completed treatment for conditions other than malignant neoplasm: Secondary | ICD-10-CM

## 2018-11-13 DIAGNOSIS — R1013 Epigastric pain: Secondary | ICD-10-CM

## 2018-11-13 DIAGNOSIS — R6889 Other general symptoms and signs: Secondary | ICD-10-CM

## 2018-11-13 DIAGNOSIS — Z23 Encounter for immunization: Secondary | ICD-10-CM

## 2018-11-13 DIAGNOSIS — Z0000 Encounter for general adult medical examination without abnormal findings: Secondary | ICD-10-CM

## 2018-11-13 LAB — POCT INFLUENZA A/B
Influenza A, POC: NEGATIVE
Influenza B, POC: NEGATIVE

## 2018-11-13 LAB — POCT URINE PREGNANCY: Preg Test, Ur: NEGATIVE

## 2018-11-13 NOTE — Patient Instructions (Addendum)
If you have lab work done today you will be contacted with your lab results within the next 2 weeks.  If you have not heard from [place] then please contact us. The fastest way to get your results is to register for My Chart.   IF you received an x-ray today, you will receive an invoice from [facility]. Please contact [facility] at [phone] with questions or concerns regarding your invoice.   IF you received labwork today, you will receive an invoice from [facility]. Please contact [facility] at [phone] with questions or concerns regarding your invoice.   Our billing staff will not be able to assist you with questions regarding bills from these companies.  You will be contacted with the lab results as soon as they are available. The fastest way to get your results is to activate your My Chart account. Instructions are located on the last page of this paperwork. If you have not heard from [place] regarding the results in 2 weeks, please contact this office.     na Viral Illness, Adult Viruses are tiny germs that can get into a person's body and cause illness. There are many different types of viruses, and they cause many types of illness. Viral illnesses can range from mild to severe. They can affect various parts of the body. Common illnesses that are caused by a virus include colds and the flu. Viral illnesses also include serious conditions such as HIV/AIDS (human immunodeficiency virus/acquired immunodeficiency syndrome). A few viruses have been linked to certain cancers. What are the causes? Many types of viruses can cause illness. Viruses invade cells in your body, multiply, and cause the infected cells to malfunction or die. When the cell dies, it releases more of the virus. When this happens, you develop symptoms of the illness, and the virus continues to spread to other cells. If the virus takes over the function of the cell, it can cause the cell to divide and grow  out of control, as is the case when a virus causes cancer. Different viruses get into the body in different ways. You can get a virus by:  Swallowing food or water that is contaminated with the virus.  Breathing in droplets that have been coughed or sneezed into the air by an infected person.  Touching a surface that has been contaminated with the virus and then touching your eyes, nose, or mouth.  Being bitten by an insect or animal that carries the virus.  Having sexual contact with a person who is infected with the virus.  Being exposed to blood or fluids that contain the virus, either through an open cut or during a transfusion. If a virus enters your body, your body's defense system (immune system) will try to fight the virus. You may be at higher risk for a viral illness if your immune system is weak. What are the signs or symptoms? Symptoms vary depending on the type of virus and the location of the cells that it invades. Common symptoms of the main types of viral illnesses include: Cold and flu viruses  Fever.  Headache.  Sore throat.  Muscle aches.  Nasal congestion.  Cough. Digestive system (gastrointestinal) viruses  Fever.  Abdominal pain.  Nausea.  Diarrhea. Liver viruses (hepatitis)  Loss of appetite.  Tiredness.  Yellowing of the skin (jaundice). Brain and spinal cord viruses  Fever.  Headache.  Stiff neck.  Nausea and vomiting.  Confusion or sleepiness. Skin viruses  Warts.  Itching.  Rash. Sexually transmitted viruses  Discharge.  Swelling.  Redness.  Rash. How is this treated? Viruses can be difficult to treat because they live within cells. Antibiotic medicines do not treat viruses because these drugs do not get inside cells. Treatment for a viral illness may include:  Resting and drinking plenty of fluids.  Medicines to relieve symptoms. These can include over-the-counter medicine for pain and fever, medicines for cough  or congestion, and medicines to relieve diarrhea.  Antiviral medicines. These drugs are available only for certain types of viruses. They may help reduce flu symptoms if taken early. There are also many antiviral medicines for hepatitis and HIV/AIDS. Some viral illnesses can be prevented with vaccinations. A common example is the flu shot. Follow these instructions at home: Medicines   Take over-the-counter and prescription medicines only as told by your health care provider.  If you were prescribed an antiviral medicine, take it as told by your health care provider. Do not stop taking the medicine even if you start to feel better.  Be aware of when antibiotics are needed and when they are not needed. Antibiotics do not treat viruses. If your health care provider thinks that you may have a bacterial infection as well as a viral infection, you may get an antibiotic. ? Do not ask for an antibiotic prescription if you have been diagnosed with a viral illness. That will not make your illness go away faster. ? Frequently taking antibiotics when they are not needed can lead to antibiotic resistance. When this develops, the medicine no longer works against the bacteria that it normally fights. General instructions  Drink enough fluids to keep your urine clear or pale yellow.  Rest as much as possible.  Return to your normal activities as told by your health care provider. Ask your health care provider what activities are safe for you.  Keep all follow-up visits as told by your health care provider. This is important. How is this prevented? Take these actions to reduce your risk of viral infection:  Eat a healthy diet and get enough rest.  Wash your hands often with soap and water. This is especially important when you are in public places. If soap and water are not available, use hand sanitizer.  Avoid close contact with friends and family who have a viral illness.  If you travel to areas  where viral gastrointestinal infection is common, avoid drinking water or eating raw food.  Keep your immunizations up to date. Get a flu shot every year as told by your health care provider.  Do not share toothbrushes, nail clippers, razors, or needles with other people.  Always practice safe sex.  Contact a health care provider if:  You have symptoms of a viral illness that do not go away.  Your symptoms come back after going away.  Your symptoms get worse. Get help right away if:  You have trouble breathing.  You have a severe headache or a stiff neck.  You have severe vomiting or abdominal pain. This information is not intended to replace advice given to you by your health care provider. Make sure you discuss any questions you have with your health care provider. Document Released: 01/20/2016 Document Revised: 02/22/2016 Document Reviewed: 01/20/2016 Elsevier Interactive Patient Education  2019 [employer]  Nausea, Adult Nausea is feeling sick to your stomach or feeling that you are about to throw up (vomit). Feeling sick to your stomach is usually not serious, but it may be an early  sign of a more serious medical problem. As you feel sicker to your stomach, you may throw up. If you throw up, or if you are not able to drink enough fluids, there is a risk that you may lose too much water in your body (get dehydrated). If you lose too much water in your body, you may:  Feel tired.  Feel thirsty.  Have a dry mouth.  Have cracked lips.  Go pee (urinate) less often. Older adults and people who have other diseases or a weak body defense system (immune system) have a higher risk of losing too much water in the body. The main goals of treating this condition are:  To relieve your nausea.  To ensure your nausea occurs less often.  To prevent throwing up and losing too much fluid. Follow these instructions at home: Watch your symptoms for any changes. Tell your doctor  about them. Follow these instructions as told by your doctor. Eating and drinking      Take an ORS (oral rehydration solution). This is a drink that is sold at pharmacies and stores.  Drink clear fluids in small amounts as you are able. These include: ? Water. ? Ice chips. ? Fruit juice that has water added (diluted fruit juice). ? Low-calorie sports drinks.  Eat bland, easy-to-digest foods in small amounts as you are able, such as: ? Bananas. ? Applesauce. ? Rice. ? Low-fat (lean) meats. ? Toast. ? Crackers.  Avoid drinking fluids that have a lot of sugar or caffeine in them. This includes energy drinks, sports drinks, and soda.  Avoid alcohol.  Avoid spicy or fatty foods. General instructions  Take over-the-counter and prescription medicines only as told by your doctor.  Rest at home while you get better.  Drink enough fluid to keep your pee (urine) pale yellow.  Take slow and deep breaths when you feel sick to your stomach.  Avoid food or things that have strong smells.  Wash your hands often with soap and water. If you cannot use soap and water, use hand sanitizer.  Make sure that all people in your home wash their hands well and often.  Keep all follow-up visits as told by your doctor. This is important. Contact a doctor if:  You feel sicker to your stomach.  You feel sick to your stomach for more than 2 days.  You throw up.  You are not able to drink fluids without throwing up.  You have new symptoms.  You have a fever.  You have a headache.  You have muscle cramps.  You have a rash.  You have pain while peeing.  You feel light-headed or dizzy. Get help right away if:  You have pain in your chest, neck, arm, or jaw.  You feel very weak or you pass out (faint).  You have throw up that is bright red or looks like coffee grounds.  You have bloody or black poop (stools) or poop that looks like tar.  You have a very bad headache, a stiff  neck, or both.  You have very bad pain, cramping, or bloating in your belly (abdomen).  You have trouble breathing or you are breathing very quickly.  Your heart is beating very quickly.  Your skin feels cold and clammy.  You feel confused.  You have signs of losing too much water in your body, such as: ? Dark pee, very little pee, or no pee. ? Cracked lips. ? Dry mouth. ? Sunken eyes. ? Sleepiness. ?  Weakness. These symptoms may be an emergency. Do not wait to see if the symptoms will go away. Get medical help right away. Call your local emergency services (911 in the [place] Do not drive yourself to the hospital. Summary  Nausea is feeling sick to your stomach or feeling that you are about to throw up (vomit).  If you throw up, or if you are not able to drink enough fluids, there is a risk that you may lose too much water in your body (get dehydrated).  Eat and drink what your doctor tells you. Take over-the-counter and prescription medicines only as told by your doctor.  Contact a doctor right away if your symptoms get worse or you have new symptoms.  Keep all follow-up visits as told by your doctor. This is important. This information is not intended to replace advice given to you by your health care provider. Make sure you discuss any questions you have with your health care provider. Document Released: 08/30/2011 Document Revised: 02/18/2018 Document Reviewed: 02/18/2018 Elsevier Interactive Patient Education  2019 [employer]  Rehydration, Adult Rehydration is the replacement of body fluids and salts and minerals (electrolytes) that are lost during dehydration. Dehydration is when there is not enough fluid or water in the body. This happens when you lose more fluids than you take in. Common causes of dehydration include:  Vomiting.  Diarrhea.  Excessive sweating, such as from heat exposure or exercise.  Taking medicines that cause the body to lose excess fluid  (diuretics).  Impaired kidney function.  Not drinking enough fluid.  Certain illnesses or infections.  Certain poorly controlled long-term (chronic) illnesses, such as diabetes, heart disease, and kidney disease.  Symptoms of mild dehydration may include thirst, dry lips and mouth, dry skin, and dizziness. Symptoms of severe dehydration may include increased heart rate, confusion, fainting, and not urinating. You can rehydrate by drinking certain fluids or getting fluids through an IV tube, as told by your health care provider. What are the risks? Generally, rehydration is safe. However, one problem that can happen is taking in too much fluid (overhydration). This is rare. If overhydration happens, it can cause an electrolyte imbalance, kidney failure, or a decrease in salt (sodium) levels in the body. How to rehydrate Follow instructions from your health care provider for rehydration. The kind of fluid you should drink and the amount you should drink depend on your condition.  If directed by your health care provider, drink an oral rehydration solution (ORS). This is a drink designed to treat dehydration that is found in pharmacies and retail stores. ? Make an ORS by following instructions on the package. ? Start by drinking small amounts, about  cup (120 mL) every 5-10 minutes. ? Slowly increase how much you drink until you have taken the amount recommended by your health care provider.  Drink enough clear fluids to keep your urine clear or pale yellow. If you were instructed to drink an ORS, finish the ORS first, then start slowly drinking other clear fluids. Drink fluids such as: ? Water. Do not drink only water. Doing that can lead to having too little sodium in your body (hyponatremia). ? Ice chips. ? Fruit juice that you have added water to (diluted juice). ? Low-calorie sports drinks.  If you are severely dehydrated, your health care provider may recommend that you receive fluids  through an IV tube in the hospital.  Do not take sodium tablets. Doing that can lead to the condition of having  too much sodium in your body (hypernatremia). Eating while you rehydrate Follow instructions from your health care provider about what to eat while you rehydrate. Your health care provider may recommend that you slowly begin eating regular foods in small amounts.  Eat foods that contain a healthy balance of electrolytes, such as bananas, oranges, potatoes, tomatoes, and spinach.  Avoid foods that are greasy or contain a lot of fat or sugar.  In some cases, you may get nutrition through a feeding tube that is passed through your nose and into your stomach (nasogastric tube, or NG tube). This may be done if you have uncontrolled vomiting or diarrhea. Beverages to avoid Certain beverages may make dehydration worse. While you rehydrate, avoid:  Alcohol.  Caffeine.  Drinks that contain a lot of sugar. These include: ? High-calorie sports drinks. ? Fruit juice that is not diluted. ? Soda.  Check nutrition labels to see how much sugar or caffeine a beverage contains. Signs of dehydration recovery You may be recovering from dehydration if:  You are urinating more often than before you started rehydrating.  Your urine is clear or pale yellow.  Your energy level improves.  You vomit less frequently.  You have diarrhea less frequently.  Your appetite improves or returns to normal.  You feel less dizzy or less light-headed.  Your skin tone and color start to look more normal. Contact a health care provider if:  You continue to have symptoms of mild dehydration, such as: ? Thirst. ? Dry lips. ? Slightly dry mouth. ? Dry, warm skin. ? Dizziness.  You continue to vomit or have diarrhea. Get help right away if:  You have symptoms of dehydration that get worse.  You feel: ? Confused. ? Weak. ? Like you are going to faint.  You have not urinated in 6-8  hours.  You have very dark urine.  You have trouble breathing.  Your heart rate while sitting still is over 100 beats a minute.  You cannot drink fluids without vomiting.  You have vomiting or diarrhea that: ? Gets worse. ? Does not go away.  You have a fever. This information is not intended to replace advice given to you by your health care provider. Make sure you discuss any questions you have with your health care provider. Document Released: 12/03/2011 Document Revised: 03/30/2016 Document Reviewed: 11/04/2015 Elsevier Interactive Patient Education  2019 [employer]  Nausea and Vomiting, Adult Nausea is feeling sick to your stomach or feeling that you are about to throw up (vomit). Vomiting is when food in your stomach is thrown up and out of the mouth. Throwing up can make you feel weak. It can also make you lose too much water in your body (get dehydrated). If you lose too much water in your body, you may:  Feel tired.  Feel thirsty.  Have a dry mouth.  Have cracked lips.  Go pee (urinate) less often. Older adults and people with other diseases or a weak body defense system (immune system) are at higher risk for losing too much water in the body. If you feel sick to your stomach and you throw up, it is important to follow instructions from your doctor about how to take care of yourself. Follow these instructions at home: Watch your symptoms for any changes. Tell your doctor about them. Follow these instructions to care for yourself at home. Eating and drinking      Take an ORS (oral rehydration solution). This is a drink that  is sold at pharmacies and stores.  Drink clear fluids in small amounts as you are able, such as: ? Water. ? Ice chips. ? Fruit juice that has water added (diluted fruit juice). ? Low-calorie sports drinks.  Eat bland, easy-to-digest foods in small amounts as you are able, such as: ? Bananas. ? Applesauce. ? Rice. ? Low-fat (lean)  meats. ? Toast. ? Crackers.  Avoid drinking fluids that have a lot of sugar or caffeine in them. This includes energy drinks, sports drinks, and soda.  Avoid alcohol.  Avoid spicy or fatty foods. General instructions  Take over-the-counter and prescription medicines only as told by your doctor.  Drink enough fluid to keep your pee (urine) pale yellow.  Wash your hands often with soap and water. If you cannot use soap and water, use hand sanitizer.  Make sure that all people in your home wash their hands well and often.  Rest at home while you get better.  Watch your condition for any changes.  Take slow and deep breaths when you feel sick to your stomach.  Keep all follow-up visits as told by your doctor. This is important. Contact a doctor if:  Your symptoms get worse.  You have new symptoms.  You have a fever.  You cannot drink fluids without throwing up.  You feel sick to your stomach for more than 2 days.  You feel light-headed or dizzy.  You have a headache.  You have muscle cramps.  You have a rash.  You have pain while peeing. Get help right away if:  You have pain in your chest, neck, arm, or jaw.  You feel very weak or you pass out (faint).  You throw up again and again.  You have throw up that is bright red or looks like black coffee grounds.  You have bloody or black poop (stools) or poop that looks like tar.  You have a very bad headache, a stiff neck, or both.  You have very bad pain, cramping, or bloating in your belly (abdomen).  You have trouble breathing.  You are breathing very quickly.  Your heart is beating very quickly.  Your skin feels cold and clammy.  You feel confused.  You have signs of losing too much water in your body, such as: ? Dark pee, very little pee, or no pee. ? Cracked lips. ? Dry mouth. ? Sunken eyes. ? Sleepiness. ? Weakness. These symptoms may be an emergency. Do not wait to see if the symptoms  will go away. Get medical help right away. Call your local emergency services (911 in the [place] Do not drive yourself to the hospital. Summary  Nausea is feeling sick to your stomach or feeling that you are about to throw up (vomit). Vomiting is when food in your stomach is thrown up and out of the mouth.  Follow instructions from your doctor about eating and drinking to keep from losing too much water in your body.  Take over-the-counter and prescription medicines only as told by your doctor.  Contact your doctor if your symptoms get worse or you have new symptoms.  Keep all follow-up visits as told by your doctor. This is important. This information is not intended to replace advice given to you by your health care provider. Make sure you discuss any questions you have with your health care provider. Document Released: 02/27/2008 Document Revised: 02/18/2018 Document Reviewed: 02/18/2018 Elsevier Interactive Patient Education  2019 [employer] Loperamide oral solution What is this  medicine? LOPERAMIDE (loe PER a mide) is used to treat diarrhea. This medicine may be used for other purposes; ask your health care provider or pharmacist if you have questions. COMMON BRAND NAME(S): Anti-Diarrheal, Imodium A-D What should I tell my health care provider before I take this medicine? They need to know if you have any of these conditions: -a black or bloody stool -bacterial food poisoning -colitis or mucus in your stool -currently taking an antibiotic medication for an infection -fever -history of irregular heartbeat -liver disease -severe abdominal pain, swelling or bulging -an unusual or allergic reaction to loperamide, other medicines, foods, dyes, or preservatives -pregnant or trying to get pregnant -breast-feeding How should I use this medicine? Take this medicine by mouth. Follow the directions on the prescription label. Use a specially marked spoon or container to measure your  medicine. Ask your pharmacist if you do not have one. Household spoons are not accurate. Take your medicine at regular intervals. Do not take your medicine more often than directed. Talk to your pediatrician regarding the use of this medicine in children. While this drug may be prescribed for children as young as 6 years for selected conditions, precautions do apply. Overdosage: If you think you have taken too much of this medicine contact a poison control center or emergency room at once. NOTE: This medicine is only for you. Do not share this medicine with others. What if I miss a dose? This does not apply. This medicine is not for regular use. Only take this medicine while you continue to have loose bowel movements. Do not take more medicine than recommended by the packaging label or by your healthcare professional. What may interact with this medicine? Do not take this medicine with any of the following medications: - alosetron This medicine may also interact with the following medications: -cimetidine -clarithromycin -erythromycin -gemfibrozil -itraconazole -ketoconazole -quinidine -quinine -ranitidine -ritonavir -saquinavir This list may not describe all possible interactions. Give your health care provider a list of all the medicines, herbs, non-prescription drugs, or dietary supplements you use. Also tell them if you smoke, drink alcohol, or use illegal drugs. Some items may interact with your medicine. What should I watch for while using this medicine? Do not take this medicine for more than 2 days without asking your doctor or health care professional. Do not use doses higher than those prescribed by your doctor or listed on the label. Check with your doctor or health care professional right away if you develop a fever, severe abdominal pain, swelling or bulging, or if you have have bloody/black diarrhea or stools. You may get drowsy or dizzy. Do not drive, use machinery, or do  anything that needs mental alertness until you know how this medicine affects you. Do not stand or sit up quickly, especially if you are an older patient. This reduces the risk of dizzy or fainting spells. Alcohol can increase possible drowsiness and dizziness. Avoid alcoholic drinks. Your mouth may get dry. Chewing sugarless gum or sucking hard candy, and drinking plenty of water may help. Contact your doctor if the problem does not go away or is severe. Drinking plenty of water can also help prevent dehydration that can occur with diarrhea. Elderly patients may have a more variable response to the effects of this medicine, and are more susceptible to the effects of dehydration. What side effects may I notice from receiving this medicine? Side effects that you should report to your doctor or health care professional as soon as possible: -  allergic reactions like skin rash, itching or hives, swelling of the face, lips, or tongue -bloated, swollen feeling in your abdomen -blurred vision -loss of appetite -signs and symptoms of a dangerous change in heartbeat or heart rhythm like chest pain; dizziness; fast or irregular heartbeat palpitations; feeling faint or lightheaded, falls; breathing problems -stomach pain Side effects that usually do not require medical attention (report to your doctor or health care professional if they continue or are bothersome): - constipation -drowsiness or dizziness -dry mouth -nausea, vomiting This list may not describe all possible side effects. Call your doctor for medical advice about side effects. You may report side effects to FDA at 1-800-FDA-1088. Where should I keep my medicine? Keep out of the reach of children. Store at room temperature between 20 and 25 degrees C (68 and 77 degrees F). Do not freeze. Keep container tightly closed. Throw away any unused medicine after the expiration date. NOTE: This sheet is a summary. It may not cover all possible  information. If you have questions about this medicine, talk to your doctor, pharmacist, or health care provider.  2019 Elsevier/Gold Standard (2017-12-02 11:23:39)

## 2018-11-13 NOTE — Progress Notes (Signed)
Established Patient--Sick Visit  Subjective:  Patient ID: Amanda Horn, female    DOB: 1986-07-29  Age: 33 y.o. MRN: [id number]  CC:  Chief Complaint  Patient presents with  . Abdominal Pain    started last night   . Emesis    HPI Amanda Horn is a 33 year old female who presents for Sick Visit.   Past Medical History:  Diagnosis Date  . Anxiety   . Depression   . Substance abuse ([place])    Current Status: Since her last office visit, she began to experienced abdominal pain yesterday evening. She reports nausea, cramping, 1 incidence of vomiting, and diarrhea. She denies eating any unique food the day before, but her boyfriend was sick the night before. No reports of GI problems such as nausea, vomiting, diarrhea, and constipation. She has no reports of blood in stools, dysuria and hematuria. She has also been having increased fatigue, headaches, night sweats, and chills for the past few days. She has been in contact with multiple employees diagnosed Influenza and Strep Throat. She denies fevers, chills, recent infections, and weight loss. She has not had any visual changes, dizziness, and falls. No chest pain, heart palpitations, cough and shortness of breath reported.  No depression or anxiety reported. She denies pain today.   No past surgical history on file.  Family History  Problem Relation Age of Onset  . Cancer Maternal Grandmother     Social History   Socioeconomic History  . Marital status: Single    Spouse name: Not on file  . Number of children: Not on file  . Years of education: Not on file  . Highest education level: Not on file  Occupational History  . Not on file  Social Needs  . Financial resource strain: Not on file  . Food insecurity:    Worry: Not on file    Inability: Not on file  . Transportation needs:    Medical: Not on file    Non-medical: Not on file  Tobacco Use  . Smoking status: Former Smoker    Packs/day: 1.50    Start date: 06/05/2018    . Smokeless tobacco: Never Used  Substance and Sexual Activity  . Alcohol use: No    Alcohol/week: 0.0 standard drinks  . Drug use: No  . Sexual activity: Yes    Partners: Male  Lifestyle  . Physical activity:    Days per week: Not on file    Minutes per session: Not on file  . Stress: Not on file  Relationships  . Social connections:    Talks on phone: Not on file    Gets together: Not on file    Attends religious service: Not on file    Active member of club or organization: Not on file    Attends meetings of clubs or organizations: Not on file    Relationship status: Not on file  . Intimate partner violence:    Fear of current or ex partner: Not on file    Emotionally abused: Not on file    Physically abused: Not on file    Forced sexual activity: Not on file  Other Topics Concern  . 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 baclofen (LIORESAL) 10 MG  tablet Take 10 mg by mouth 3 (three) times daily.    Marland Kitchen gabapentin (NEURONTIN) 600 MG tablet Take 600 mg by mouth 3 (three) times daily.    . norethindrone-ethinyl estradiol (MICROGESTIN,JUNEL,LOESTRIN) 1-20 MG-MCG tablet Take 1 tablet by mouth daily. 3 Package 4  . tretinoin (RETIN-A) 0.025 % gel Apply topically at bedtime. 45 g 1  . valACYclovir (VALTREX) 1000 MG tablet Take 1 tablet (1,000 mg total) by mouth daily. Increase to bid for three days PRN outbreak 40 tablet 5  . venlafaxine XR (EFFEXOR-XR) 150 MG 24 hr capsule Take 150 mg by mouth daily with breakfast.    . ondansetron (ZOFRAN) 4 MG tablet Take 1 tablet (4 mg total) by mouth every 8 (eight) hours as needed for nausea or vomiting. 20 tablet 0   No facility-administered medications prior to visit.     Allergies  Allergen Reactions  . Penicillins Hives  . Sulfa Antibiotics Hives    ROS Review  of Systems  Constitutional: Positive for fatigue (increased).  HENT: Negative.   Eyes: Negative.   Respiratory: Negative.   Cardiovascular: Negative.   Gastrointestinal: Positive for abdominal pain.  Endocrine: Negative.   Genitourinary: Negative.   Musculoskeletal: Negative.   Skin: Negative.   Allergic/Immunologic: Negative.   Neurological: Negative.   Hematological: Negative.   Psychiatric/Behavioral: Negative.    Objective:    Physical Exam  Constitutional: She is oriented to person, place, and time. She appears well-developed and well-nourished.  HENT:  Head: Normocephalic and atraumatic.  Eyes: Conjunctivae are normal.  Neck: Normal range of motion. Neck supple.  Cardiovascular: Normal rate, regular rhythm, normal heart sounds and intact distal pulses.  Pulmonary/Chest: Effort normal and breath sounds normal.  Abdominal: Soft. Bowel sounds are normal.  Musculoskeletal: Normal range of motion.  Neurological: She is alert and oriented to person, place, and time. She has normal reflexes.  Skin: Skin is warm and dry.  Psychiatric: She has a normal mood and affect. Her behavior is normal. Judgment and thought content normal.  Nursing note and vitals reviewed.   BP 110/73 (BP Location: Right Arm, Patient Position: Sitting, Cuff Size: Normal)   Pulse 81   Temp 98.4 F (36.9 C) (Oral)   Ht 5' 3.5" (1.613 m)   Wt 113 lb 7.5 oz (51.5 kg)   LMP  (LMP Unknown)   SpO2 100%   BMI 19.79 kg/m  Wt Readings from Last 3 Encounters:  11/13/18 113 lb 7.5 oz (51.5 kg)  04/29/18 112 lb 12.8 oz (51.2 kg)  03/11/18 109 lb 6.4 oz (49.6 kg)     There are no preventive care reminders to display for this patient.  There are no preventive care reminders to display for this patient.  No results found for: TSH No results found for: WBC, HGB, HCT, MCV, PLT No results found for: NA, K, CHLORIDE, CO2, GLUCOSE, BUN, CREATININE, BILITOT, ALKPHOS, AST, ALT, PROT, ALBUMIN, CALCIUM, ANIONGAP,  EGFR, GFR No results found for: CHOL No results found for: HDL No results found for: LDLCALC No results found for: TRIG No results found for: CHOLHDL No results found for: HGBA1C  Assessment & Plan:   1. Flu-like symptoms Influenza test is negative.  - Influenza A/B  2. Abdominal pain, epigastric Probable r/t viral infection. She will continue OTC medications to aide in cold symptoms, increased liquids, bowel rest, and get plenty of rest. She will report to office if signs and symptoms di not improve or worsen.   3. Healthcare maintenance -  Flu Vaccine QUAD 36+ mos IM - POCT urine pregnancy  4. Follow up She will follow up as needed.   Problem List Items Addressed This Visit    None    Visit Diagnoses    Flu-like symptoms    -  Primary   Relevant Orders   Influenza A/B (Completed)   Abdominal pain, epigastric       Healthcare maintenance       Relevant Orders   Flu Vaccine QUAD 36+ mos IM (Completed)   POCT urine pregnancy (Completed)   Follow up          No orders of the defined types were placed in this encounter.   Follow-up: No follow-ups on file.    Azzie Glatter, FNP

## 2019-02-17 ENCOUNTER — Ambulatory Visit: Payer: Self-pay | Admitting: *Deleted

## 2019-02-17 NOTE — Telephone Encounter (Signed)
Pt called stating that she went to dentist for teeth cleaning because her temp was 99.9; she is also having fatigue, and headache which started 4-5 days ago; she is to return to work on 5/27; her worst complain in headache rated 4, and fatigue; she answers no to questions to [place]; she has also been social distancing, and wearing a mask; recommendations made per nurse triage protocol; she verbalized understanding; pt transferred to [facility] for scheduling.   Reason for Disposition . Fever present > 3 days (72 hours)  Answer Assessment - Initial Assessment Questions 1. TEMPERATURE: "What is the most recent temperature?"  "How was it measured?"      99.9 02/17/2019 at 0945 2. ONSET: "When did the fever start?"      02/17/2019 3. SYMPTOMS: "Do you have any other symptoms besides the fever?"  (e.g., colds, headache, sore throat, earache, cough, rash, diarrhea, vomiting, abdominal pain)    Headache, fatigue 4. CAUSE: If there are no symptoms, ask: "What do you think is causing the fever?"     Not sure 5. CONTACTS: "Does anyone else in the family have an infection?"     no 6. TREATMENT: "What have you done so far to treat this fever?" (e.g., medications)     no 7. IMMUNOCOMPROMISE: "Do you have of the following: diabetes, HIV positive, splenectomy, cancer chemotherapy, chronic steroid treatment, transplant patient, etc."    no 8. PREGNANCY: "Is there any chance you are pregnant?" "When was your last menstrual period?"     No LMP around 01/18/2019 birth control pills 9. TRAVEL: "Have you traveled out of the country in the last month?" (e.g., travel history, exposures)  no  Protocols used: FEVER-A-AH

## 2019-02-18 ENCOUNTER — Encounter: Payer: Self-pay | Admitting: Family Medicine

## 2019-02-18 ENCOUNTER — Telehealth (INDEPENDENT_AMBULATORY_CARE_PROVIDER_SITE_OTHER): Payer: Managed Care, Other (non HMO) | Admitting: Family Medicine

## 2019-02-18 ENCOUNTER — Other Ambulatory Visit: Payer: Self-pay

## 2019-02-18 VITALS — Temp 99.0°F | Ht 63.5 in | Wt 115.0 lb

## 2019-02-18 DIAGNOSIS — R509 Fever, unspecified: Secondary | ICD-10-CM

## 2019-02-18 NOTE — Progress Notes (Signed)
Telemedicine Encounter- SOAP NOTE Established Patient  I discussed the limitations, risks, security and privacy concerns of performing an evaluation and management service by telephone and the availability of in person appointments. I also discussed with the patient that there may be a patient responsible charge related to this service. The patient expressed understanding and agreed to proceed.  This telephone encounter was conducted with the patient's verbal consent via audio telecommunications: yes Patient was instructed to have this encounter in a suitably private space; and to only have persons present to whom they give permission to participate. In addition, patient identity was confirmed by use of name plus two identifiers (DOB and address).  I spent a total of 11min talking with the patient .  Subjective   Amanda Horn is a 33 y.o. female established patient. Telephone visit today for temperature elevation noted at her dentist  HPI pt seen at the dentist office yesterday-99.9 temp at dentist office Company criteria 99.4 for return to work per pt. 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  Pt with no cough, fatigue, no sore throat, no diarrhea, no rash   Patient Active Problem List   Diagnosis Date Noted  . Multiple nevi 03/11/2018  . GAD (generalized anxiety disorder) 03/11/2018  . Smoker 03/11/2018  . 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 Anxiety   . Depression   . Substance abuse ([place])     Current Outpatient Medications  Medication Sig Dispense Refill  . gabapentin (NEURONTIN) 600 MG tablet Take 600 mg by mouth 3 (three) times daily.    . norethindrone-ethinyl estradiol (MICROGESTIN,JUNEL,LOESTRIN) 1-20 MG-MCG tablet Take 1 tablet by mouth daily. 3 Package 4  . valACYclovir (VALTREX) 1000 MG tablet Take 1 tablet (1,000 mg total) by mouth daily. Increase to bid for three days PRN outbreak 40  tablet 5  . venlafaxine XR (EFFEXOR-XR) 150 MG 24 hr capsule Take 150 mg by mouth daily with breakfast.    . baclofen (LIORESAL) 10 MG tablet Take 10 mg by mouth 3 (three) times daily.    Marland Kitchen tretinoin (RETIN-A) 0.025 % gel Apply topically at bedtime. (Patient not taking: Reported on 02/18/2019) 45 g 1   No current facility-administered medications for this visit.     Allergies  Allergen Reactions  . Penicillins Hives  . Sulfa Antibiotics Hives    Social History   Socioeconomic History  . Marital status: Single    Spouse name: Not on file  . Number of children: Not on file  . Years of education: Not on file  . Highest education level: Not on file  Occupational History  . Not on file  Social Needs  . Financial resource strain: Not on file  . Food insecurity:    Worry: Not on file    Inability: Not on file  . Transportation needs:    Medical: Not on file    Non-medical: Not on file  Tobacco Use  . Smoking status: Former Smoker    Packs/day: 1.50    Start date: 06/05/2018  . Smokeless tobacco: Never Used  Substance and Sexual Activity  . Alcohol use: No    Alcohol/week: 0.0 standard drinks  . Drug use: No  . Sexual activity: Yes    Partners: Male  Lifestyle  . Physical activity:    Days per week: Not on file    Minutes per session: Not on file  . Stress:  Not on file  Relationships  . Social connections:    Talks on phone: Not on file    Gets together: Not on file    Attends religious service: Not on file    Active member of club or organization: Not on file    Attends meetings of clubs or organizations: Not on file    Relationship status: Not on file  . Intimate partner violence:    Fear of current or ex partner: Not on file    Emotionally abused: Not on file    Physically abused: Not on file    Forced sexual activity: Not on file  Other Topics Concern  . 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  HENT: Negative for congestion and sinus pain.   Respiratory: Negative for cough.   Cardiovascular: Negative for chest pain.  Gastrointestinal: Negative for diarrhea.  Skin: Negative for rash.  Neurological: Negative for weakness and headaches.    Objective   Vitals as reported by the patient: Today's Vitals   02/18/19 0911  Temp: 99 F (37.2 C)  TempSrc: Oral  Weight: 115 lb (52.2 kg)  Height: 5' 3.5" (1.613 m)   1. Fever, unspecified D/w pt must be under company appropriate temp for 3 days. Do not use tylenol or ibuprofen. If any other symptoms occur-cough, sob, s/t, rash, fatigue or other concerns, additional time office may be warranted .  I discussed the assessment and treatment plan with the patient. The patient was provided an opportunity to ask questions and all were answered. The patient agreed with the plan and demonstrated an understanding of the instructions. I provided 29minutes of non-face-to-face time during this encounter.   Hannah Beat, MD  Primary Care at [facility] 02-18-19

## 2019-02-18 NOTE — Progress Notes (Signed)
Pt c/o fatigue and headache for about 3 days. Also having some sneezing. Pt did go to dentist yesterday and they took her temp at 99.9 so she monitored her temp all day and it ranged from 99.9 to 99.4. She took her temp today and it was 99.

## 2019-03-08 ENCOUNTER — Other Ambulatory Visit: Payer: Self-pay | Admitting: Physician Assistant

## 2019-03-08 DIAGNOSIS — Z3009 Encounter for other general counseling and advice on contraception: Secondary | ICD-10-CM

## 2019-03-09 NOTE — Telephone Encounter (Signed)
Please advise if Dr. Pamella Pert would like to take over rx

## 2019-03-13 ENCOUNTER — Other Ambulatory Visit: Payer: Self-pay

## 2019-03-13 ENCOUNTER — Encounter: Payer: Self-pay | Admitting: Family Medicine

## 2019-03-13 ENCOUNTER — Telehealth: Payer: Self-pay | Admitting: *Deleted

## 2019-03-13 ENCOUNTER — Ambulatory Visit: Payer: Managed Care, Other (non HMO) | Admitting: Family Medicine

## 2019-03-13 VITALS — BP 109/77 | HR 64 | Temp 99.1°F | Ht 63.5 in | Wt 114.8 lb

## 2019-03-13 DIAGNOSIS — N926 Irregular menstruation, unspecified: Secondary | ICD-10-CM

## 2019-03-13 DIAGNOSIS — F419 Anxiety disorder, unspecified: Secondary | ICD-10-CM

## 2019-03-13 DIAGNOSIS — F329 Major depressive disorder, single episode, unspecified: Secondary | ICD-10-CM

## 2019-03-13 DIAGNOSIS — Z20822 Contact with and (suspected) exposure to covid-19: Secondary | ICD-10-CM

## 2019-03-13 DIAGNOSIS — J029 Acute pharyngitis, unspecified: Secondary | ICD-10-CM | POA: Diagnosis not present

## 2019-03-13 DIAGNOSIS — L680 Hirsutism: Secondary | ICD-10-CM | POA: Diagnosis not present

## 2019-03-13 MED ORDER — VENLAFAXINE HCL ER 150 MG PO CP24: 150.0000 mg | ORAL_CAPSULE | Freq: Every day | ORAL | 1 refills | Status: DC

## 2019-03-13 MED ORDER — GABAPENTIN 600 MG PO TABS: 600.0000 mg | ORAL_TABLET | Freq: Three times a day (TID) | ORAL | 1 refills | Status: DC

## 2019-03-13 NOTE — Patient Instructions (Signed)
° ° ° °  If you have lab work done today you will be contacted with your lab results within the next 2 weeks.  If you have not heard from us then please contact us. The fastest way to get your results is to register for My Chart. ° ° °IF you received an x-ray today, you will receive an invoice from [facility]. Please contact [facility] at [phone] with questions or concerns regarding your invoice.  ° °IF you received labwork today, you will receive an invoice from [facility]. Please contact [facility] at [phone] with questions or concerns regarding your invoice.  ° °Our billing staff will not be able to assist you with questions regarding bills from these companies. ° °You will be contacted with the lab results as soon as they are available. The fastest way to get your results is to activate your My Chart account. Instructions are located on the last page of this paperwork. If you have not heard from us regarding the results in 2 weeks, please contact this office. °  ° ° ° °

## 2019-03-13 NOTE — Progress Notes (Signed)
 6/19/20209:41 AM  Amanda Horn 04/20/1986, 32 y.o., female [id number]  Chief Complaint  Patient presents with  . Neck Pain    having pain for the neck and swollen lymphs on the left side on neck    HPI:   Patient is a 32 y.o. female  who presents today for neck pain  Previous PCP Sarah Weber, PA-C On effexor and gabapentin for depression and anxiety - meds are working well for her Baclofen takes prn, prescribed in [place] originally, cravings of etoh Abstinent for 4 years, main drug of choice etoh, goes to AA On OCPs - not very happy on current method, originally started due to acne, ovarian cysts and hirsutism, still has irregular menses even on BC, requesting to have hormones checked Strong maternal FHx ovarian, her mother BRCA negative  3 days ago having sore throat, painful to swallow, tender and swollen left lymph node with associated surrounding muscle pain Denies any associated fever Endorses headaches, nasal congestion, thick nasal drainage, sinus pressure, mild sneezing, mild cough, mild abd cramping No ear pain,  no itchiness, denies allergies, SOB Denies any changes in taste or smell, diarrhea 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 0 0 0 No No  Number falls in past yr: 0 - 0 - -  Injury with Fall? 0 - 0 - -  Follow up - - Falls evaluation completed - -     Allergies  Allergen Reactions  . Penicillins Hives  . Sulfa Antibiotics Hives    Prior to Admission medications   Medication Sig Start Date End Date Taking? Authorizing Provider  baclofen (LIORESAL) 10 MG tablet Take 10 mg by mouth 3 (three) times daily.   Yes [provider]  gabapentin  (NEURONTIN) 600 MG tablet Take 600 mg by mouth 3 (three) times daily.   Yes [provider]  norethindrone-ethinyl estradiol (MICROGESTIN,JUNEL,LOESTRIN) 1-20 MG-MCG tablet Take 1 tablet by mouth daily. 04/29/18  Yes Wiseman, Brittany D, PA-C  tretinoin (RETIN-A) 0.025 % gel Apply topically at bedtime. 04/29/18  Yes Wiseman, Brittany D, PA-C  valACYclovir (VALTREX) 1000 MG tablet Take 1 tablet (1,000 mg total) by mouth daily. Increase to bid for three days PRN outbreak 07/22/16  Yes Smith, Kristi M, MD  venlafaxine XR (EFFEXOR-XR) 150 MG 24 hr capsule Take 150 mg by mouth daily with breakfast.   Yes [provider]    Past Medical History:  Diagnosis Date  . Anxiety   . Depression   . Substance abuse ([facility])     No past surgical history on file.  Social History   Tobacco Use  . Smoking status: Former Smoker    Packs/day: 1.50    Start date: 06/05/2018  . Smokeless tobacco: Never Used  Substance Use Topics  . Alcohol use: No    Alcohol/week: 0.0 standard drinks    Family History  Problem Relation Age of Onset  . Cancer Maternal Grandmother     ROS Per hpi  OBJECTIVE:  Today's Vitals   03/13/19 0934  BP: 109/77  Pulse: 64  Temp: 99.1 F (37.3 C)  TempSrc: Oral  SpO2: 100%  Weight: 114 lb 12.8 oz (52.1 kg)    Height: 5' 3.5" (1.613 m)   Body mass index is 20.02 kg/m.   Physical Exam Vitals signs and nursing note reviewed.  Constitutional:      Appearance: She is well-developed.  HENT:     Head: Normocephalic and atraumatic.     Right Ear: Hearing, tympanic membrane, ear canal and external ear normal.     Left Ear: Hearing, tympanic membrane, ear canal and external ear normal.     Mouth/Throat:     Pharynx: No oropharyngeal exudate or posterior oropharyngeal erythema.  Eyes:     Conjunctiva/sclera: Conjunctivae normal.     Pupils: Pupils are equal, round, and reactive to light.  Neck:     Musculoskeletal: Neck supple.     Thyroid: No  thyromegaly.  Cardiovascular:     Rate and Rhythm: Normal rate and regular rhythm.     Heart sounds: Normal heart sounds. No murmur. No friction rub. No gallop.   Pulmonary:     Effort: Pulmonary effort is normal.     Breath sounds: Normal breath sounds. No wheezing or rales.  Lymphadenopathy:     Cervical: No cervical adenopathy.  Skin:    General: Skin is warm and dry.  Neurological:     Mental Status: She is alert and oriented to person, place, and time.    ASSESSMENT and PLAN  1. Acute sore throat Overall improved. Recent mono exposure. 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 Discussed self isolation.  - EPSTEIN-BARR VIRUS (EBV) Antibody Profile  2. Irregular menses - Ambulatory referral to Obstetrics / Gynecology - TSH+Prl+FSH+TestT+LH+DHEA S...  3. Hirsutism - Ambulatory referral to Obstetrics / Gynecology - TSH+Prl+FSH+TestT+LH+DHEA S...  4. Anxiety and depression Controlled. Continue current regime.   Other orders - venlafaxine XR (EFFEXOR-XR) 150 MG 24 hr capsule; Take 1 capsule (150 mg total) by mouth daily with breakfast. - gabapentin (NEURONTIN) 600 MG tablet; Take 1 tablet (600 mg total) by mouth 3 (three) times daily.  Return in about 6 months (around 09/12/2019).    Rutherford Guys, MD Primary Care at [facility] [place], [place] [address] Ph.  [phone] Fax [phone]

## 2019-03-13 NOTE — Telephone Encounter (Signed)
Scheduled for testing today @ [facility] @ 1. Instructions given and order placed

## 2019-03-16 LAB — EPSTEIN-BARR VIRUS (EBV) ANTIBODY PROFILE
EBV NA IgG: 75.5 U/mL — ABNORMAL HIGH (ref 0.0–17.9)
EBV VCA IgG: 341 U/mL — ABNORMAL HIGH (ref 0.0–17.9)
EBV VCA IgM: <36 U/mL (ref 0.0–35.9)

## 2019-03-18 ENCOUNTER — Telehealth: Payer: Self-pay | Admitting: Family Medicine

## 2019-03-18 NOTE — Telephone Encounter (Signed)
Copied from [place] [id number]. Topic: General - Inquiry >> Mar 18, 2019 12:44 PM Mathis Bud wrote: Reason for CRM: Patient is requesting another note due covid testing not being back yet. Patient would like note to state the 3-5 business days start Monday 6/22.   Call back # [phone]

## 2019-03-19 ENCOUNTER — Encounter: Payer: Self-pay | Admitting: Family Medicine

## 2019-03-19 LAB — TSH+PRL+FSH+TESTT+LH+DHEA S...
17-Hydroxyprogesterone: 109 ng/dL
Androstenedione: 67 ng/dL (ref 41–262)
DHEA-SO4: 171.9 ug/dL (ref 84.8–378.0)
FSH: 5.9 m[IU]/mL
LH: 4.2 m[IU]/mL
Prolactin: 17.5 ng/mL (ref 4.8–23.3)
TSH: 1.46 u[IU]/mL (ref 0.450–4.500)
Testosterone, Free: 4.5 pg/mL — ABNORMAL HIGH (ref 0.0–4.2)
Testosterone: 82 ng/dL — ABNORMAL HIGH (ref 8–48)

## 2019-03-19 LAB — NOVEL CORONAVIRUS, NAA: SARS-CoV-2, NAA: NOT DETECTED

## 2019-03-23 NOTE — Telephone Encounter (Signed)
Note has already been completed by office manager for pt to print off mychart.

## 2019-03-30 ENCOUNTER — Encounter: Payer: Self-pay | Admitting: Family Medicine

## 2019-04-03 NOTE — Telephone Encounter (Signed)
I called pt and let her know I would need a verbal auth before I can release her medical records

## 2019-04-07 NOTE — Telephone Encounter (Signed)
I spoke with pt and got a verbal auth to release her medical records to [facility], Dr. Melba Coon

## 2019-04-07 NOTE — Telephone Encounter (Signed)
I got a verbal auth to release her medical records to Dr. Melba Coon at [facility]. I will mail her records out.

## 2019-06-17 ENCOUNTER — Encounter: Payer: Self-pay | Admitting: Family Medicine

## 2019-09-11 ENCOUNTER — Ambulatory Visit: Payer: Managed Care, Other (non HMO) | Admitting: Family Medicine

## 2019-09-19 ENCOUNTER — Other Ambulatory Visit: Payer: Self-pay | Admitting: Family Medicine

## 2019-09-19 NOTE — Telephone Encounter (Signed)
Forwarding medication refill request to the clinical pool for review. 

## 2019-09-21 NOTE — Telephone Encounter (Signed)
Advised pt to scheduled ov with [place] before next refill is due

## 2019-09-21 NOTE — Telephone Encounter (Signed)
Please schedule patient an appt for any further refills

## 2019-12-11 ENCOUNTER — Encounter: Payer: Self-pay | Admitting: Certified Nurse Midwife

## 2019-12-16 IMAGING — MG DIGITAL DIAGNOSTIC BILATERAL MAMMOGRAM WITH TOMO AND CAD
6 of 10 series · 6 of 30 positions shown · non-contrast
Comparison: Ultrasound, left breast, 04/04/2017.

CLINICAL DATA: Patient reports a palpable lump along the medial
aspect of the right breast, which she noted last week and earlier
this week, but cannot feel today. This was associated with her
period. She was also seen last year, March 2017, or a small palpable
left breast mass. A small hypoechoic lesion was found on ultrasound.
She was to return to have diagnostic mammography this is her first
return visit since that ultrasound exam. She no longer feels the
left breast lump.

EXAM:
DIGITAL DIAGNOSTIC BILATERAL MAMMOGRAM WITH CAD AND TOMO
ULTRASOUND BILATERAL BREAST

[R MLO synth-2D]
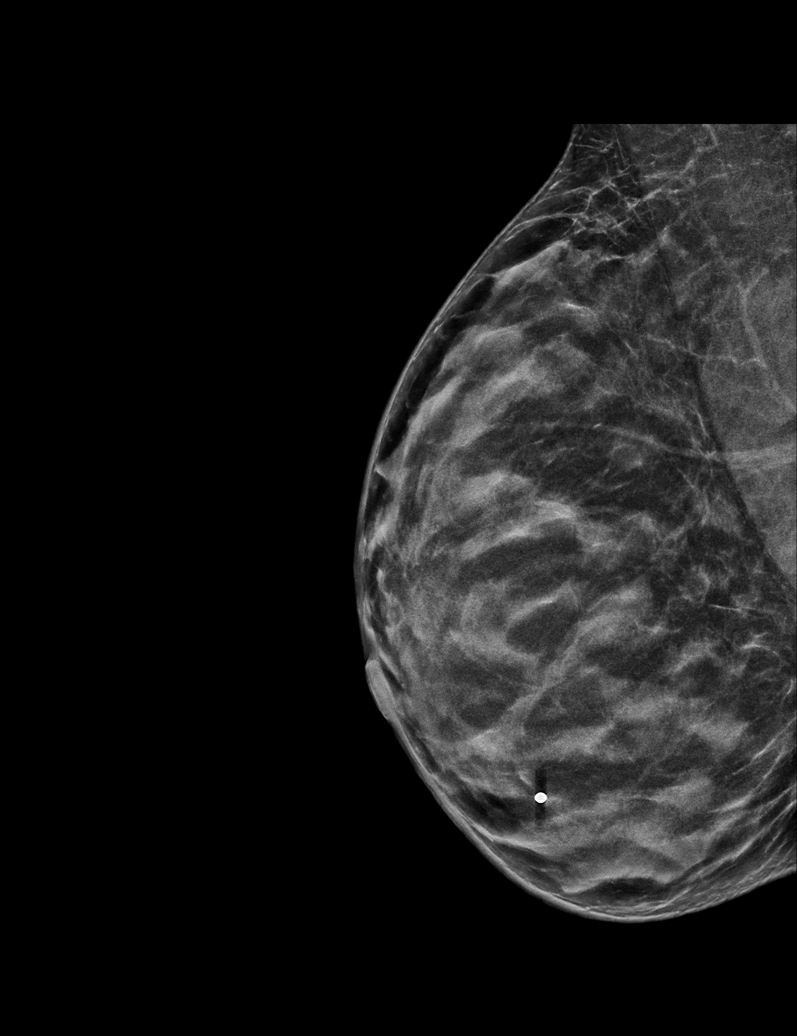

[R CC synth-2D (1 of 2)]
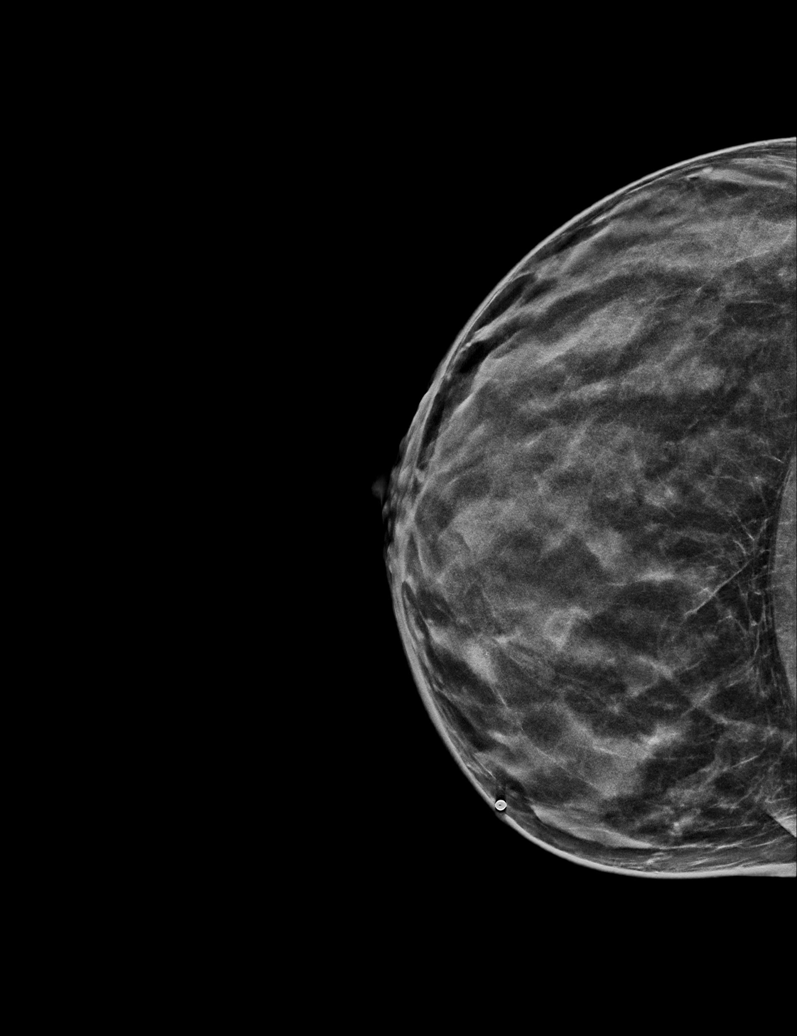

[R CC synth-2D (2 of 2)]
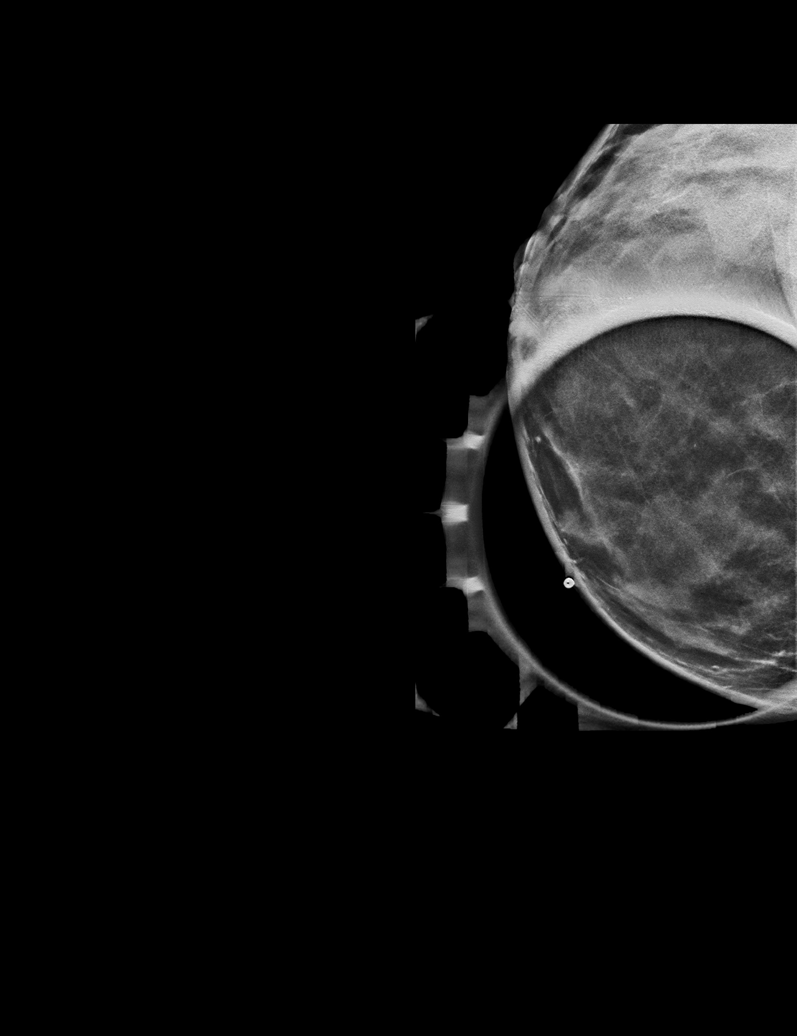

[L MLO synth-2D]
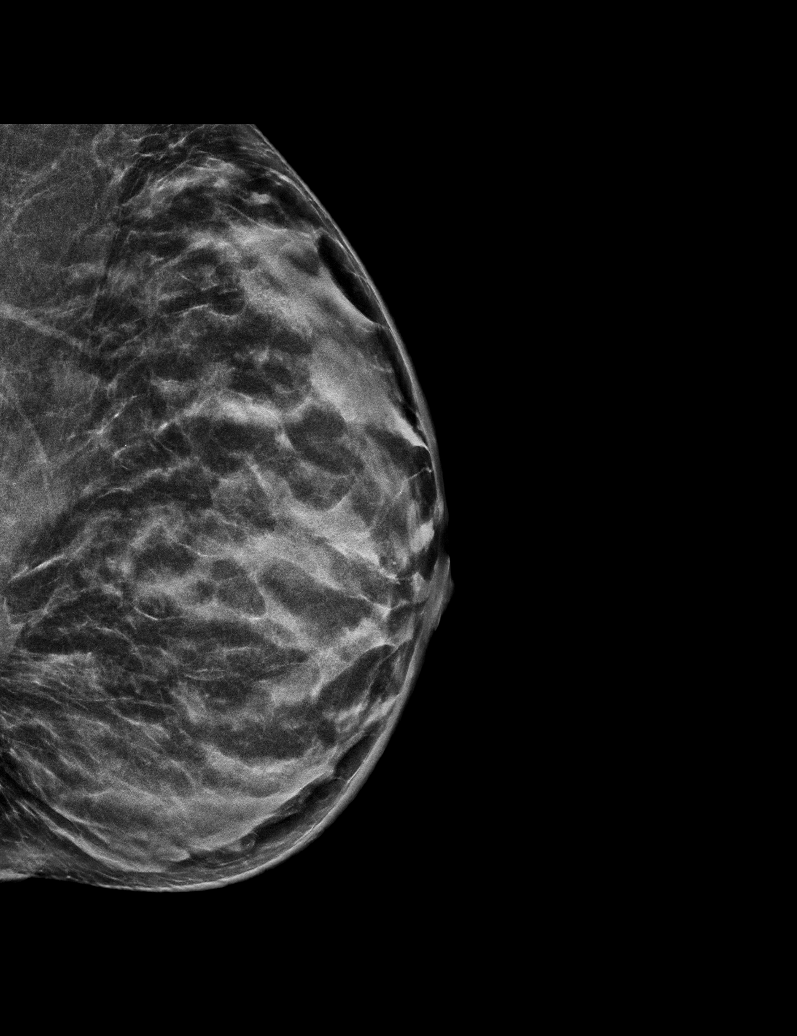

[L CC synth-2D]
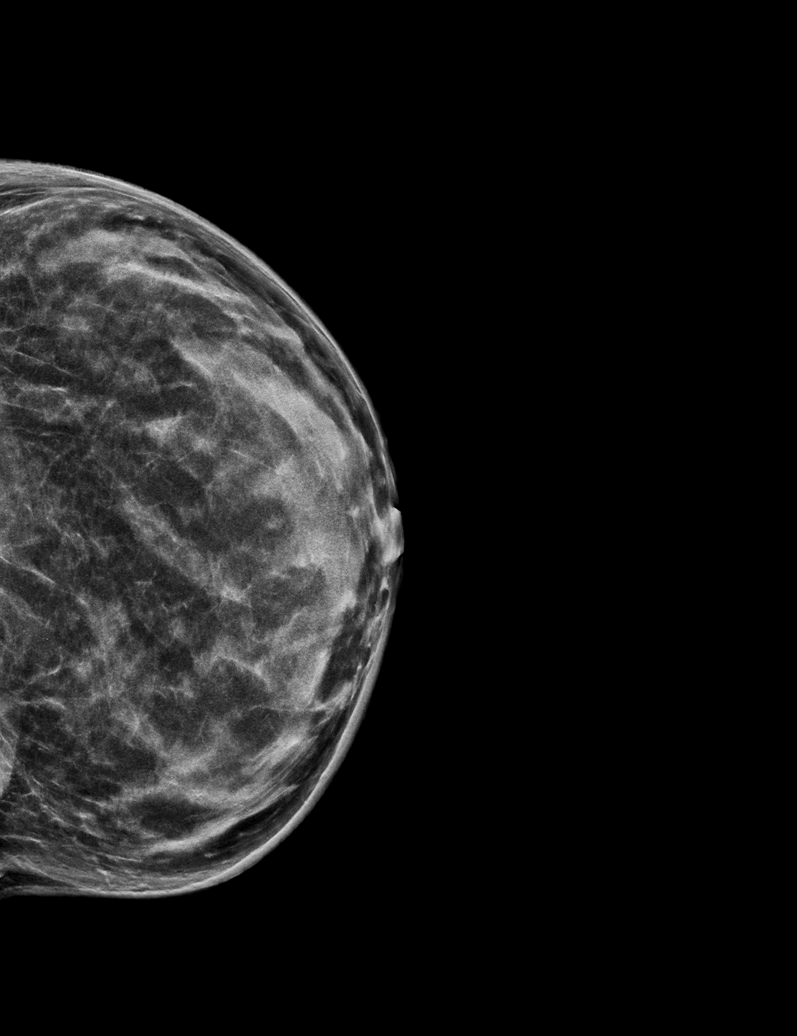

[R CC tomo · tomo slice 21/41.0]
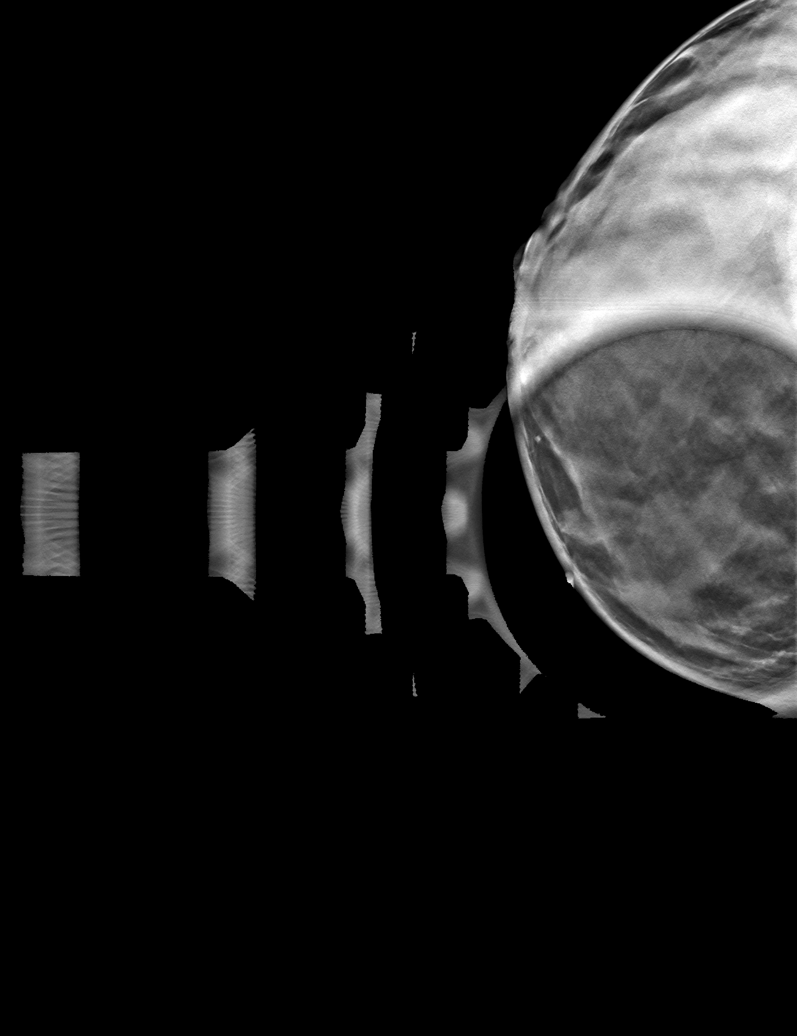

[6 of 30 positions shown; findings below may reference images not displayed]

ACR Breast Density Category c: The breast tissue is heterogeneously
dense, which may obscure small masses.
FINDINGS: There are no discrete masses, areas of architectural distortion,
areas of significant asymmetry or suspicious calcifications.

Mammographic images were processed with CAD.

On physical exam, no mass is palpated in the medial right breast.

Targeted right breast ultrasound is performed, showing normal tissue
in the medial right breast in the area of the previously palpated
lump. No mass or suspicious lesion.

Targeted left breast ultrasound is performed, showing normal tissue
in the 12:30 o'clock position of the left breast, 6 cm the nipple,
where the prior ultrasound showed a small ill-defined hypoechoic
mass or lesion. This lesion is no longer present.
IMPRESSION: Normal exam.  No evidence of breast malignancy.

RECOMMENDATION:
Screening mammogram at age 41 unless there are persistent or
intervening clinical concerns. (Code:[id number])

I have discussed the findings and recommendations with the patient.
Results were also provided in writing at the conclusion of the
visit. If applicable, a reminder letter will be sent to the patient
regarding the next appointment.

BI-RADS CATEGORY  1: Negative.

## 2019-12-16 IMAGING — US ULTRASOUND RIGHT BREAST LIMITED
1 series · 2 of 2 positions shown · non-contrast
Comparison: Ultrasound, left breast, 04/04/2017.

CLINICAL DATA: Patient reports a palpable lump along the medial
aspect of the right breast, which she noted last week and earlier
this week, but cannot feel today. This was associated with her
period. She was also seen last year, March 2017, or a small palpable
left breast mass. A small hypoechoic lesion was found on ultrasound.
She was to return to have diagnostic mammography this is her first
return visit since that ultrasound exam. She no longer feels the
left breast lump.

EXAM:
DIGITAL DIAGNOSTIC BILATERAL MAMMOGRAM WITH CAD AND TOMO
ULTRASOUND BILATERAL BREAST

[Series 1: ultrasound right breast limited · 0.06mm/px · 2 of 2 slices shown]
[im 1/2]
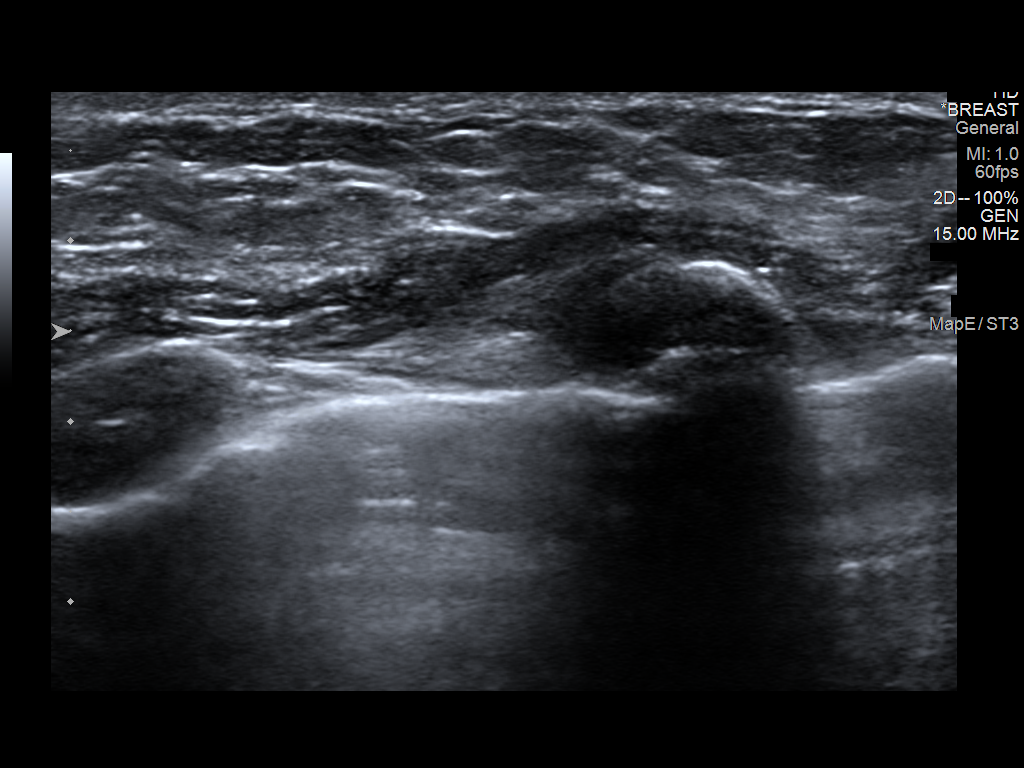
[im 2/2]
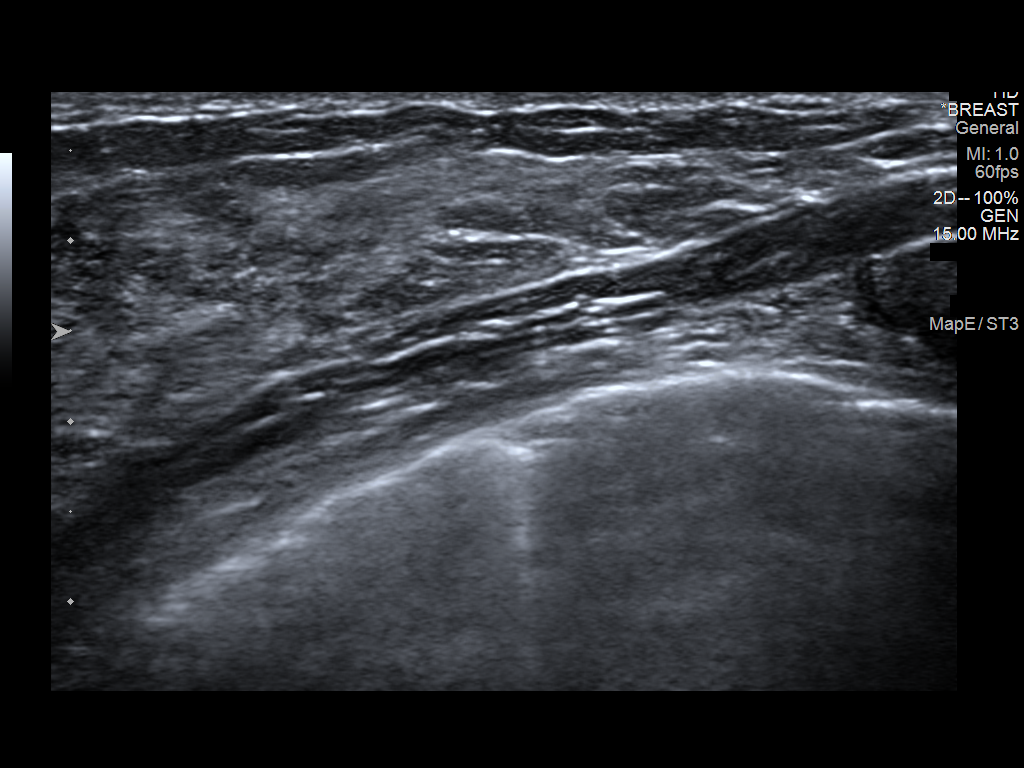

[2 of 2 positions shown; findings below may reference images not displayed]

ACR Breast Density Category c: The breast tissue is heterogeneously
dense, which may obscure small masses.
FINDINGS: There are no discrete masses, areas of architectural distortion,
areas of significant asymmetry or suspicious calcifications.

Mammographic images were processed with CAD.

On physical exam, no mass is palpated in the medial right breast.

Targeted right breast ultrasound is performed, showing normal tissue
in the medial right breast in the area of the previously palpated
lump. No mass or suspicious lesion.

Targeted left breast ultrasound is performed, showing normal tissue
in the 12:30 o'clock position of the left breast, 6 cm the nipple,
where the prior ultrasound showed a small ill-defined hypoechoic
mass or lesion. This lesion is no longer present.
IMPRESSION: Normal exam.  No evidence of breast malignancy.

RECOMMENDATION:
Screening mammogram at age 41 unless there are persistent or
intervening clinical concerns. (Code:[id number])

I have discussed the findings and recommendations with the patient.
Results were also provided in writing at the conclusion of the
visit. If applicable, a reminder letter will be sent to the patient
regarding the next appointment.

BI-RADS CATEGORY  1: Negative.

## 2020-02-16 ENCOUNTER — Other Ambulatory Visit: Payer: Self-pay | Admitting: Family Medicine

## 2020-02-16 NOTE — Telephone Encounter (Signed)
Patient is requesting a refill of the following medications: Requested Prescriptions   Pending Prescriptions Disp Refills  . venlafaxine XR (EFFEXOR-XR) 150 MG 24 hr capsule [Pharmacy Med Name: VENLAFAXINE HCL ER 150 MG CAP] 30 capsule 0    Sig: TAKE ONE CAPSULE BY MOUTH DAILY WITH BREAKFAST    Date of patient request:02/16/2020 Last office visit: 03/12/2020 Date of last refill: 09/21/2019 Last refill amount: 30 Capsules  Follow up time period per chart: N/a
